# Patient Record
Sex: Female | Born: 1940
Health system: Southern US, Community
[De-identification: ages and names within clinical notes are randomized; demographics above are authoritative.]

## PROBLEM LIST (undated history)

## (undated) DIAGNOSIS — R238 Other skin changes: Secondary | ICD-10-CM

## (undated) DIAGNOSIS — H409 Unspecified glaucoma: Secondary | ICD-10-CM

## (undated) DIAGNOSIS — E785 Hyperlipidemia, unspecified: Secondary | ICD-10-CM

## (undated) DIAGNOSIS — R51 Headache: Secondary | ICD-10-CM

## (undated) DIAGNOSIS — I341 Nonrheumatic mitral (valve) prolapse: Secondary | ICD-10-CM

## (undated) DIAGNOSIS — R233 Spontaneous ecchymoses: Secondary | ICD-10-CM

## (undated) DIAGNOSIS — M1990 Unspecified osteoarthritis, unspecified site: Secondary | ICD-10-CM

## (undated) DIAGNOSIS — I351 Nonrheumatic aortic (valve) insufficiency: Secondary | ICD-10-CM

## (undated) DIAGNOSIS — M8580 Other specified disorders of bone density and structure, unspecified site: Secondary | ICD-10-CM

## (undated) DIAGNOSIS — C439 Malignant melanoma of skin, unspecified: Secondary | ICD-10-CM

## (undated) DIAGNOSIS — R519 Headache, unspecified: Secondary | ICD-10-CM

## (undated) HISTORY — DX: Headache: R51

## (undated) HISTORY — DX: Malignant melanoma of skin, unspecified: C43.9

## (undated) HISTORY — DX: Other specified disorders of bone density and structure, unspecified site: M85.80

## (undated) HISTORY — PX: EYE SURGERY: SHX253

## (undated) HISTORY — PX: HERNIA REPAIR: SHX51

## (undated) HISTORY — DX: Nonrheumatic aortic (valve) insufficiency: I35.1

## (undated) HISTORY — DX: Hyperlipidemia, unspecified: E78.5

## (undated) HISTORY — PX: JOINT REPLACEMENT: SHX530

## (undated) HISTORY — DX: Nonrheumatic mitral (valve) prolapse: I34.1

## (undated) HISTORY — DX: Headache, unspecified: R51.9

## (undated) HISTORY — DX: Unspecified glaucoma: H40.9

---

## 1991-08-09 DIAGNOSIS — C439 Malignant melanoma of skin, unspecified: Secondary | ICD-10-CM

## 1991-08-09 HISTORY — DX: Malignant melanoma of skin, unspecified: C43.9

## 1998-02-19 ENCOUNTER — Other Ambulatory Visit: Admission: RE | Admit: 1998-02-19 | Discharge: 1998-02-19 | Payer: Self-pay | Admitting: *Deleted

## 1998-03-20 ENCOUNTER — Ambulatory Visit (HOSPITAL_COMMUNITY): Admission: RE | Admit: 1998-03-20 | Discharge: 1998-03-20 | Payer: Self-pay | Admitting: *Deleted

## 1999-02-23 ENCOUNTER — Other Ambulatory Visit: Admission: RE | Admit: 1999-02-23 | Discharge: 1999-02-23 | Payer: Self-pay | Admitting: *Deleted

## 1999-03-23 ENCOUNTER — Ambulatory Visit (HOSPITAL_COMMUNITY): Admission: RE | Admit: 1999-03-23 | Discharge: 1999-03-23 | Payer: Self-pay | Admitting: *Deleted

## 1999-03-23 ENCOUNTER — Encounter: Payer: Self-pay | Admitting: *Deleted

## 2000-02-25 ENCOUNTER — Other Ambulatory Visit: Admission: RE | Admit: 2000-02-25 | Discharge: 2000-02-25 | Payer: Self-pay | Admitting: *Deleted

## 2000-03-24 ENCOUNTER — Encounter: Payer: Self-pay | Admitting: *Deleted

## 2000-03-24 ENCOUNTER — Ambulatory Visit (HOSPITAL_COMMUNITY): Admission: RE | Admit: 2000-03-24 | Discharge: 2000-03-24 | Payer: Self-pay | Admitting: *Deleted

## 2001-02-15 ENCOUNTER — Other Ambulatory Visit: Admission: RE | Admit: 2001-02-15 | Discharge: 2001-02-15 | Payer: Self-pay | Admitting: *Deleted

## 2001-03-28 ENCOUNTER — Ambulatory Visit (HOSPITAL_COMMUNITY): Admission: RE | Admit: 2001-03-28 | Discharge: 2001-03-28 | Payer: Self-pay | Admitting: *Deleted

## 2001-03-28 ENCOUNTER — Encounter: Payer: Self-pay | Admitting: *Deleted

## 2001-12-18 ENCOUNTER — Encounter: Payer: Self-pay | Admitting: Internal Medicine

## 2001-12-18 ENCOUNTER — Encounter: Admission: RE | Admit: 2001-12-18 | Discharge: 2001-12-18 | Payer: Self-pay | Admitting: Internal Medicine

## 2002-04-02 ENCOUNTER — Encounter: Payer: Self-pay | Admitting: *Deleted

## 2002-04-02 ENCOUNTER — Ambulatory Visit (HOSPITAL_COMMUNITY): Admission: RE | Admit: 2002-04-02 | Discharge: 2002-04-02 | Payer: Self-pay | Admitting: *Deleted

## 2003-02-28 ENCOUNTER — Other Ambulatory Visit: Admission: RE | Admit: 2003-02-28 | Discharge: 2003-02-28 | Payer: Self-pay | Admitting: Obstetrics and Gynecology

## 2003-03-11 ENCOUNTER — Encounter: Payer: Self-pay | Admitting: Obstetrics and Gynecology

## 2003-03-11 ENCOUNTER — Ambulatory Visit (HOSPITAL_COMMUNITY): Admission: RE | Admit: 2003-03-11 | Discharge: 2003-03-11 | Payer: Self-pay | Admitting: Obstetrics and Gynecology

## 2004-03-01 ENCOUNTER — Other Ambulatory Visit: Admission: RE | Admit: 2004-03-01 | Discharge: 2004-03-01 | Payer: Self-pay | Admitting: Obstetrics and Gynecology

## 2004-03-16 ENCOUNTER — Ambulatory Visit (HOSPITAL_COMMUNITY): Admission: RE | Admit: 2004-03-16 | Discharge: 2004-03-16 | Payer: Self-pay | Admitting: Obstetrics and Gynecology

## 2005-03-23 ENCOUNTER — Other Ambulatory Visit: Admission: RE | Admit: 2005-03-23 | Discharge: 2005-03-23 | Payer: Self-pay | Admitting: Obstetrics and Gynecology

## 2005-03-24 ENCOUNTER — Ambulatory Visit (HOSPITAL_COMMUNITY): Admission: RE | Admit: 2005-03-24 | Discharge: 2005-03-24 | Payer: Self-pay | Admitting: Obstetrics and Gynecology

## 2006-03-07 ENCOUNTER — Ambulatory Visit (HOSPITAL_BASED_OUTPATIENT_CLINIC_OR_DEPARTMENT_OTHER): Admission: RE | Admit: 2006-03-07 | Discharge: 2006-03-07 | Payer: Self-pay | Admitting: Surgery

## 2006-03-27 ENCOUNTER — Ambulatory Visit (HOSPITAL_COMMUNITY): Admission: RE | Admit: 2006-03-27 | Discharge: 2006-03-27 | Payer: Self-pay | Admitting: Obstetrics and Gynecology

## 2006-05-17 ENCOUNTER — Ambulatory Visit (HOSPITAL_BASED_OUTPATIENT_CLINIC_OR_DEPARTMENT_OTHER): Admission: RE | Admit: 2006-05-17 | Discharge: 2006-05-17 | Payer: Self-pay | Admitting: Surgery

## 2006-06-27 ENCOUNTER — Encounter: Admission: RE | Admit: 2006-06-27 | Discharge: 2006-06-27 | Payer: Self-pay | Admitting: Surgery

## 2006-12-06 ENCOUNTER — Inpatient Hospital Stay (HOSPITAL_COMMUNITY): Admission: AD | Admit: 2006-12-06 | Discharge: 2006-12-08 | Payer: Self-pay | Admitting: Surgery

## 2006-12-06 ENCOUNTER — Encounter: Admission: RE | Admit: 2006-12-06 | Discharge: 2006-12-06 | Payer: Self-pay | Admitting: Surgery

## 2006-12-22 ENCOUNTER — Encounter: Admission: RE | Admit: 2006-12-22 | Discharge: 2006-12-22 | Payer: Self-pay | Admitting: Surgery

## 2007-03-08 ENCOUNTER — Encounter: Admission: RE | Admit: 2007-03-08 | Discharge: 2007-03-08 | Payer: Self-pay | Admitting: Surgery

## 2007-04-02 ENCOUNTER — Ambulatory Visit (HOSPITAL_COMMUNITY): Admission: RE | Admit: 2007-04-02 | Discharge: 2007-04-02 | Payer: Self-pay | Admitting: Obstetrics and Gynecology

## 2007-04-12 ENCOUNTER — Encounter: Admission: RE | Admit: 2007-04-12 | Discharge: 2007-04-12 | Payer: Self-pay | Admitting: Surgery

## 2007-06-15 ENCOUNTER — Emergency Department (HOSPITAL_COMMUNITY): Admission: EM | Admit: 2007-06-15 | Discharge: 2007-06-15 | Payer: Self-pay | Admitting: Emergency Medicine

## 2007-06-20 ENCOUNTER — Encounter: Admission: RE | Admit: 2007-06-20 | Discharge: 2007-06-20 | Payer: Self-pay | Admitting: Surgery

## 2007-06-25 ENCOUNTER — Emergency Department (HOSPITAL_COMMUNITY): Admission: EM | Admit: 2007-06-25 | Discharge: 2007-06-25 | Payer: Self-pay | Admitting: Emergency Medicine

## 2008-04-03 ENCOUNTER — Ambulatory Visit (HOSPITAL_COMMUNITY): Admission: RE | Admit: 2008-04-03 | Discharge: 2008-04-03 | Payer: Self-pay | Admitting: Obstetrics and Gynecology

## 2009-04-06 ENCOUNTER — Ambulatory Visit (HOSPITAL_COMMUNITY): Admission: RE | Admit: 2009-04-06 | Discharge: 2009-04-06 | Payer: Self-pay | Admitting: Obstetrics and Gynecology

## 2010-04-08 ENCOUNTER — Ambulatory Visit (HOSPITAL_COMMUNITY): Admission: RE | Admit: 2010-04-08 | Discharge: 2010-04-08 | Payer: Self-pay | Admitting: Obstetrics and Gynecology

## 2010-07-08 DIAGNOSIS — I351 Nonrheumatic aortic (valve) insufficiency: Secondary | ICD-10-CM

## 2010-07-08 HISTORY — DX: Nonrheumatic aortic (valve) insufficiency: I35.1

## 2010-07-19 ENCOUNTER — Encounter
Admission: RE | Admit: 2010-07-19 | Discharge: 2010-07-19 | Payer: Self-pay | Source: Home / Self Care | Attending: Family Medicine | Admitting: Family Medicine

## 2010-08-30 LAB — DIFFERENTIAL
Basophils Absolute: 0 10*3/uL (ref 0.0–0.1)
Basophils Relative: 1 % (ref 0–1)
Lymphocytes Relative: 26 % (ref 12–46)
Monocytes Relative: 9 % (ref 3–12)
Neutro Abs: 4 10*3/uL (ref 1.7–7.7)
Neutrophils Relative %: 64 % (ref 43–77)

## 2010-08-30 LAB — COMPREHENSIVE METABOLIC PANEL
ALT: 11 U/L (ref 0–35)
AST: 18 U/L (ref 0–37)
Calcium: 9.4 mg/dL (ref 8.4–10.5)
GFR calc Af Amer: >60 mL/min (ref >60)
Sodium: 138 mEq/L (ref 135–145)
Total Protein: 7.8 g/dL (ref 6.0–8.3)

## 2010-08-30 LAB — URINALYSIS, ROUTINE W REFLEX MICROSCOPIC
Hgb urine dipstick: NEGATIVE
Specific Gravity, Urine: 1.019 (ref 1.005–1.030)
Urobilinogen, UA: 0.2 mg/dL (ref 0.0–1.0)
pH: 6.5 (ref 5.0–8.0)

## 2010-08-30 LAB — CBC
HCT: 40.1 % (ref 36.0–46.0)
Hemoglobin: 13 g/dL (ref 12.0–15.0)
MCH: 31.4 pg (ref 26.0–34.0)
RBC: 4.14 MIL/uL (ref 3.87–5.11)

## 2010-08-30 LAB — PROTIME-INR
INR: 0.97 (ref 0.00–1.49)
Prothrombin Time: 13.1 seconds (ref 11.6–15.2)

## 2010-08-30 NOTE — H&P (Addendum)
Kelly Grimes, Kelly Grimes             ACCOUNT NO.:  [id number]  MEDICAL RECORD NO.:  [id number]         PATIENT TYPE:  LINP  LOCATION:                               FACILITY:  [facility]  PHYSICIAN:  Georges Lynch. Dylynn Ketner, M.D.DATE OF BIRTH:  1941/04/04  DATE OF ADMISSION:  08/31/2010 DATE OF DISCHARGE:                             HISTORY & PHYSICAL   CHIEF COMPLAINT:  Right hip pain.  BRIEF HISTORY:  Kelly Grimes has been followed by Dr. Darrelyn Hillock for worsening pain in the right hip.  She is to a point now where she is unable to complete daily tasks without pain and it is significantly limiting what she is able to do.  She now presents for right total hip arthroplasty.  MEDICATION ALLERGIES:  None.  PRIMARY CARE PHYSICIAN:  Juluis Rainier, MD  CURRENT MEDICATIONS: 1. Travatan Z once daily in both eyes. 2. Fish oil. 3. Bilberry. 4. Multivitamin. 5. Calcium plus D. 6. Vitamin C. 7. She will discontinue fish oil and multivitamin prior to surgery.  PAST MEDICAL HISTORY: 1. End-stage arthritis of the right hip. 2. Cataracts. 3. Heart murmur. 4. History of hemorrhoids.  Denies any blood in the stool. 5. History of urinary tract infection several years ago. 6. History of cystitis. 7. She does have cancer history of melanoma. 8. History of measles as a child. 9. History of mumps as a child. 10.History of rubella as a child. 11.History of strep throat infections. 12.History of osteopenia. 13.Arthritis. 14.History of menopause.  PAST SURGICAL HISTORY: 1. Inguinal hernia repair x2 in 2007 and again inguinal hernia repair     x1 in 2008. 2. Femoral hernia repair in 2009.  FAMILY HISTORY:  Father passed of pancreatic cancer at age of 40. Mother passed of complications from Alzheimer's at the age of 21.  SOCIAL HISTORY:  The patient is married.  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  She denies past or present use of tobacco or alcohol products.   She lives at home with her family, however, she will need to go to a skilled nursing facility following her hospital stay and she would like to go to [place] if possible, so we will need to get a social work consult to arrange this.  REVIEW OF SYSTEMS:  GENERAL:  Negative for fevers, chills, or weight change.  HEENT/NEURO:  Negative for headache, blurred vision, or insomnia.  DERMATOLOGIC:  Negative for rash or lesion.  RESPIRATORY: Negative shortness of breath at rest or with exertion.  CARDIOVASCULAR: Negative for chest pain or palpitations.  GI:  Negative for nausea, vomiting, or diarrhea.  GU:  Negative for hematuria, or dysuria. MUSCULOSKELETAL:  Positive for joint pain.  PHYSICAL EXAMINATION:  VITAL SIGNS:  Pulse 80, respirations 18, and blood pressure 122/80 in the left arm. GENERAL:  Kelly Grimes is alert and oriented x3.  She is well developed, well nourished in no apparent distress. HEENT:  Normocephalic, atraumatic.  Extraocular movements are intact. NECK:  Supple, full range of motion without lymphadenopathy. CHEST:  Lungs are clear to auscultation bilaterally without wheezes, rhonchi, or rales. HEART:  Regular rate and rhythm  without murmur, S1 and S2 sounds are appreciated. ABDOMEN:  Bowel sounds are present in all 4 quadrants.  Abdomen is soft, nontender, and nondistended to palpation. EXTREMITIES:  Right hip significantly decreased range of motion and pain with attempted range of motion. SKIN:  Unremarkable. NEUROLOGIC:  Intact.  Carotid pulses 2+ bilaterally without bruit.  RADIOGRAPHS:  AP and lateral views of the right hip reveal severe end- stage degenerative arthritis in the right hip.  IMPRESSION:  Degenerative arthritis of the right hip.  PLAN:  Right total hip arthroplasty to be performed by Dr. Darrelyn Hillock.     Rozell Searing, PAC   ______________________________ Georges Lynch Darrelyn Hillock, M.D.   LD/MEDQ  D:  08/29/2010  T:  08/29/2010  Job:   [id number]  cc:   Juluis Rainier, MD  Electronically Signed by Ranee Gosselin M.D. on 08/30/2010 11:51:56 AM Electronically Signed by Rozell Searing  on 08/30/2010 03:31:57 PM

## 2010-08-31 ENCOUNTER — Inpatient Hospital Stay (HOSPITAL_COMMUNITY)
Admission: RE | Admit: 2010-08-31 | Discharge: 2010-09-03 | Payer: Self-pay | Source: Home / Self Care | Attending: Orthopedic Surgery | Admitting: Orthopedic Surgery

## 2010-09-01 LAB — TYPE AND SCREEN: Antibody Screen: NEGATIVE

## 2010-09-01 LAB — PROTIME-INR
INR: 1.34 (ref 0.00–1.49)
Prothrombin Time: 16.8 seconds — ABNORMAL HIGH (ref 11.6–15.2)

## 2010-09-01 LAB — ABO/RH: ABO/RH(D): A POS

## 2010-09-03 LAB — HEMOGLOBIN AND HEMATOCRIT, BLOOD
HCT: 25.3 % — ABNORMAL LOW (ref 36.0–46.0)
Hemoglobin: 8.5 g/dL — ABNORMAL LOW (ref 12.0–15.0)

## 2010-09-03 LAB — PROTIME-INR: INR: 1.33 (ref 0.00–1.49)

## 2010-09-03 NOTE — Discharge Summary (Addendum)
Kelly Grimes, Kelly Grimes             ACCOUNT NO.:  [id number]  MEDICAL RECORD NO.:  [id number]          PATIENT TYPE:  INP  LOCATION:  1620                         FACILITY:  [facility]  PHYSICIAN:  Georges Lynch. Gioffre, M.D.DATE OF BIRTH:  05-21-1941  DATE OF ADMISSION:  08/31/2010 DATE OF DISCHARGE:                              DISCHARGE SUMMARY   ADMITTING DIAGNOSES: 1. End-stage arthritis of the right hip. 2. Cataracts. 3. Heart murmur. 4. History of hemorrhoids. 5. History of urinary tract infection. 6. History of cystitis. 7. Skin cancer, history of melanoma. 8. History of measles as a child. 9. History of mumps as a child. 10.History of rubella as a child. 11.History of strep throat. 12.History of osteopenia. 13.Arthritis. 14.History of menopause.  DISCHARGE DIAGNOSES: 1. End-stage arthritis of the right hip, status post right total hip     arthroplasty. 2. End-stage arthritis of the right hip. 3. Cataracts. 4. Heart murmur. 5. History of hemorrhoids. 6. History of urinary tract infection. 7. History of cystitis. 8. Skin cancer, history of melanoma. 9. History of measles as a child. 10.History of mumps as a child. 11.History of rubella as a child. 12.History of strep throat. 13.History of osteopenia. 14.Arthritis. 15.History of menopause.  HOSPITAL COURSE:  The patient was admitted to [facility] on August 31, 2010.  She underwent the stated procedure without complication.  After adequate time in the recovery room, she was taken to the orthopedic floor.  She was placed on PCA, analgesics and muscle relaxants for pain control and the patient was started on Coumadin, heparin protocol per Pharmacy to protect against blood clots.  On operative day that evening, the patient became nauseated from the PCA so on postoperative day #1, we discontinued PCA and gave her pain medication by mouth which she seems to tolerate much better. Postoperative day #1, the  patient was able to ambulate with physical therapy and did quite well.  Postoperative day #2, dressing was changed. Wound was well approximated.  No signs of infection.  She continued to do well with her physical therapy and pain was well controlled. Postoperative day #3, the patient continues to progress, doing well with her therapy; however, still needs assistance with stairs and daily tasks.  Disposition to skilled nursing facility on postoperative day #3, September 03, 2010.  PROCEDURE:  On August 31, 2010, Ms. Ige was taken to the operating room by surgeon Dr. Ranee Gosselin, assistant Dr. Durene Romans and Rozell Searing, PA-C.  She underwent right total hip arthroplasty.  The procedure was performed under general anesthesia.  Routine orthopedic prep and drape were carried out.  She received 1 g of IV Ancef preoperatively.  There were no complications with the procedure.  The patient was returned to the recovery room in satisfactory condition. Postoperative radiograph reveals new right total hip prosthesis that is anatomically aligned.  LABORATORY DATA:  Preoperative labs revealed CBC with a white count of 6.2, hemoglobin 13, hematocrit 40.1 and a platelet count of 271. Preoperative INR is 0.97.  She was not on anticoagulants preoperatively. Preoperative chemistry panel was unremarkable as is preoperative urinalysis.  Preoperative PCR for MRSA was negative  for Staph aureus, if positive we will treat with antibiotics appropriately.  On postoperative day #1, the patient's hemoglobin had dropped to 9.7.  Her INR had reached 1.3.  Postoperative day #2, hemoglobin dropped further to 9.0, INR had reached 1.85 and on postoperative day #3, hemoglobin dropped to a low of 8.5.  She has not required blood transfusion throughout her hospital stay and her INR was at 1.33.  MEDICATIONS ON DISCHARGE: 1. Acetaminophen 325 mg 1-2 tablets p.o. q. 4-6 hours p.r.n.     breakthrough pain or  fever. 2. Colace 100 mg 1 tablet p.o. b.i.d. p.r.n. constipation. 3. Robaxin 500 mg 1 tablet p.o. q.6 hours p.r.n. muscle spasm. 4. Ferrous sulfate 325 mg 1 tablet p.o. t.i.d. 5. Oxycodone/acetaminophen 10/650 mg 1 tablet p.o. q.4-6 hours p.r.n.     pain. 6. Travatan Z ophthalmic 0.004% one drop both eyes q.h.s. 7. Coumadin.  This should be dosed per skilled nursing facility.  Her     INR needs to be kept between 2 and 3 to remain therapeutic. 8. Lovenox 40 mg subcu once on Saturday and then it will be     discontinued.  ACTIVITY:  She should increase her activity slowly.  She needs to use walker for ambulation.  She works with physical therapy at the skilled nursing facility.  DIET:  No restrictions.  WOUND CARE:  Daily dressing changes will be needed.  FOLLOWUP:  She should follow up with Dr. Darrelyn Hillock in 2 weeks from the day of surgery.  Skilled nursing facility should contact the office at [phone] to arrange this appointment.  CONDITION ON DISCHARGE:  Improving.     Rozell Searing, PAC   ______________________________ Georges Lynch Darrelyn Hillock, M.D.    LD/MEDQ  D:  09/03/2010  T:  09/03/2010  Job:  [id number]  Electronically Signed by Rozell Searing  on 09/03/2010 02:28:15 PM Electronically Signed by Ranee Gosselin M.D. on 09/06/2010 08:06:11 AM

## 2010-09-06 NOTE — Op Note (Signed)
NAMELAYCI, STENGLEIN             ACCOUNT NO.:  [id number]  MEDICAL RECORD NO.:  [id number]          PATIENT TYPE:  INP  LOCATION:  1620                         FACILITY:  [facility]  PHYSICIAN:  Georges Lynch. Gioffre, M.D.DATE OF BIRTH:  1940/11/14  DATE OF PROCEDURE: DATE OF DISCHARGE:                              OPERATIVE REPORT   PREOPERATIVE DIAGNOSIS:  Severe degenerative arthritis of the right hip with some shortening of the right lower extremity about a quarter and a half inch.  POSTOPERATIVE DIAGNOSIS:  Severe degenerative arthritis of the right hip with some shortening of the right lower extremity about a quarter and a half inch.  SURGEON:  Georges Lynch. Gioffre, M.D.  ASSISTANTS: 1. Madlyn Frankel Charlann Boxer, M.D. 2. Rozell Searing, [facility]  OPERATION:  Right total hip arthroplasty utilizing the DePuy system. The sizes used; the femoral stem was a size 4 standard Tri-Lock stem. We utilized a AltrX polyethylene acetabular liner, +4 neutral.  We used a Pinnacle cup size 50-mm outside diameter with a hole eliminator.  We utilized 1 cancellus screw for fixation 30 mm in length.  The head was a +1, 32-mm diameter ceramic femoral head.  DESCRIPTION OF PROCEDURE:  Under general anesthesia, the patient on the left side right side up.  We went through the appropriate time-out procedure.  Prior to that in the holding area, I marked the appropriate right leg.  The patient had 1 g of IV Ancef.  After sterile prep and drape of the right hip was carried out, posterolateral approach to hip was carried out.  Bleeders were identified and cauterized.  I inserted self-retaining retractors.  I then went down and incised the iliotibial band, separated the gluteal muscle by blunt dissection.  I then went down and partially detached the external rotators with great care taken not to injure the sciatic nerve.  I then did a capsulectomy.  Following that, I dislocated the femoral head.  She had severe arthritic  changes. I then amputated the head at the appropriate neck length.  I then used the box osteotome to remove the cancellous bone from the trochanter and then the widening reamer, and then we utilized the canal finder.  I then irrigated out the femoral stem and femoral canal multiple times during the procedure.  I then rasped the femur up to a size 4 Tri-Lock stem. Once this was done, I then prepared the acetabulum by completing the capsulectomy.  I then reamed the acetabulum up to a size 49 for a 50-mm cup.  The permanent 50-mm Pinnacle cup was inserted.  We affixed the cup with one screw after we checked the orientation with the Charnley angle finder.  At this time, we then inserted our manhole cover.  We then inserted our permanent AltrX cup.  Following that, we inserted our permanent Tri-Lock stem size 4.  Once this was done, we went through various neck lengths and finally selected the +1 neck length.  This was a standard Tri-Lock stem we used.  We had excellent stability, excellent leg length.  We measured leg length both with the +1 and +5, and felt the +1 was the most  accurate to use for leg length and stability.  We cleared out the acetabulum, reduced the hip, took hip through motion with a permanent ceramic head in place.  We had good position, good stability, and good leg length.  I then thoroughly irrigated out the area.  I injected 10 cc of Surgiflo into the lower acetabular region and then some thrombin-soaked Gelfoam and closed the wound in layers in usual fashion. Sterile Neosporin dressing was applied.  The patient left the operating room in satisfactory condition.          ______________________________ Georges Lynch Darrelyn Hillock, M.D.     RAG/MEDQ  D:  08/31/2010  T:  08/31/2010  Job:  [id number]  cc:   Dr. Zachery Dauer  Electronically Signed by Ranee Gosselin M.D. on 09/06/2010 08:06:09 AM

## 2010-09-21 ENCOUNTER — Ambulatory Visit: Payer: Medicare Other | Attending: Orthopedic Surgery | Admitting: Physical Therapy

## 2010-09-21 DIAGNOSIS — IMO0001 Reserved for inherently not codable concepts without codable children: Secondary | ICD-10-CM | POA: Insufficient documentation

## 2010-09-21 DIAGNOSIS — R262 Difficulty in walking, not elsewhere classified: Secondary | ICD-10-CM | POA: Insufficient documentation

## 2010-09-21 DIAGNOSIS — M6281 Muscle weakness (generalized): Secondary | ICD-10-CM | POA: Insufficient documentation

## 2010-09-21 DIAGNOSIS — R5381 Other malaise: Secondary | ICD-10-CM | POA: Insufficient documentation

## 2010-09-24 ENCOUNTER — Encounter: Payer: Medicare Other | Admitting: Physical Therapy

## 2010-09-28 ENCOUNTER — Encounter: Payer: Medicare Other | Admitting: Physical Therapy

## 2010-10-01 ENCOUNTER — Encounter: Payer: Medicare Other | Admitting: Physical Therapy

## 2010-10-05 ENCOUNTER — Encounter: Payer: Medicare Other | Admitting: Physical Therapy

## 2010-10-08 ENCOUNTER — Encounter: Payer: Medicare Other | Admitting: Physical Therapy

## 2010-12-24 NOTE — Op Note (Signed)
NAMELAYKIN, RAINONE             ACCOUNT NO.:  [id number]   MEDICAL RECORD NO.:  [id number]          PATIENT TYPE:  AMB   LOCATION:  DSC                          FACILITY:  [facility]   PHYSICIAN:  Wilmon Arms. Corliss Skains, M.D. DATE OF BIRTH:  [birth year]   DATE OF PROCEDURE:  03/07/2006  DATE OF DISCHARGE:                                 OPERATIVE REPORT   PREOPERATIVE DIAGNOSIS:  Right inguinal hernia.   POSTOPERATIVE DIAGNOSIS:  Right inguinal hernia.   PROCEDURE PERFORMED:  Right inguinal hernia repair with mesh.   SURGEON:  Wilmon Arms. Tsuei, M.D.   ANESTHESIA:  General.   INDICATIONS:  The patient is 70 year old female in excellent health who  presents with an incidental finding of right inguinal bulge.  This has been  present for several months.  The patient is very active and does a lot of  yard work and this is where she first noticed the bulge.  There is no real  pain but this area has become larger and more firm.  She presents for  elective repair.   DESCRIPTION OF PROCEDURE:  The patient brought to the operating room and  placed in supine position on operating table.  After an adequate level of  general anesthesia was obtained, the patient's right groin was shaved,  prepped with Betadine and draped in sterile fashion.  The area above her  inguinal ligament was infiltrated with 0.25% Marcaine with epinephrine.  The  incision was made above the inguinal ligament.  Dissection was carried down  into the external oblique fascia.  The external oblique fascia was opened  along direction of its fibers.  The round ligament was identified was  circumferentially dissected.  Two small hernia defects were noted at the  internal ring and immediately adjacent the internal ring.  The hernia sacs  were reduced through these defects.  No other defects were noted.  The  larger defect was closed with a figure-of-eight 2-0 Vicryl suture.  A  keyhole mesh was then cut to fit the wound and secured  beginning at the  pubic tubercle.  This was secured to the internal oblique fascia superiorly  and the Cooper's ligament inferiorly.  The tails were tucked around the  round ligament and secured together with 2-0 Vicryl suture.  The tails were  tucked underneath the external oblique fascia.  The external oblique fascia  was then closed with 2-0 Vicryl in running fashion.  3-0 Vicryl was used to  close subcutaneous tissues and 4-0 Monocryl was used to close the skin in  subcuticular fashion.  Steri-Strips and clean dressings were applied.  The  patient was then extubated, brought to recovery in stable condition.  All  sponge, instrument and needle counts correct.     Wilmon Arms. Tsuei, M.D.  Electronically Signed    MKT/MEDQ  D:  03/07/2006  T:  03/07/2006  Job:  [id number]   cc:   Marcelino Duster L. Vincente Poli, M.D.

## 2010-12-24 NOTE — Op Note (Signed)
Kelly Grimes, Kelly Grimes             ACCOUNT NO.:  [id number]   MEDICAL RECORD NO.:  [id number]          PATIENT TYPE:  INP   LOCATION:  5735                         FACILITY:  [facility]   PHYSICIAN:  Wilmon Arms. Corliss Skains, M.D. DATE OF BIRTH:  July 27, 1941   DATE OF PROCEDURE:  12/06/2006  DATE OF DISCHARGE:                               OPERATIVE REPORT   PREOPERATIVE DIAGNOSIS:  Incarcerated recurrent right inguinal hernia.   POSTOPERATIVE DIAGNOSIS:  Recurrent right inguinal hernia.   PROCEDURE PERFORMED:  1. Diagnostic laparoscopy.  2. Open right preperitoneal inguinal hernia repair with mesh.   SURGEON:  Wilmon Arms. Corliss Skains, M.D.   ASSISTANT:  Adolph Pollack, M.D.   ANESTHESIA:  General endotracheal.   INDICATIONS:  The patient is a 70 year old female in excellent health,  who is status post a right inguinal hernia mesh repair in 2007.  She had  an early recurrence and was found to have torn the mesh loose from the  surrounding muscle.  This was repaired with another sheet of mesh.  The  patient has been doing very well.  She presented today with acute onset  of right groin pain.  A CT scan showed a recurrence of her hernia with  incarcerated small bowel.  She was emergently admitted to the hospital  in preparation for exploration.  In the preoperative area, she was noted  to have decreased pain and the bulge had resolved.  This seemed like the  incarcerated small bowel may have been reduced.   DESCRIPTION OF PROCEDURE:  The patient was brought to the operating room  and placed in a supine position on the operating room table with her  arms tucked.  After an adequate level of general anesthesia was  obtained, a Foley catheter was placed under sterile technique.  The  patient's abdomen was prepped with Betadine and draped in sterile  fashion.  Pneumatic compression hose had been placed prior to induction.  A time-out was taken to assure the proper patient and proper procedure.  The area below her umbilicus was infiltrated with 0.25% Marcaine.  A  transverse incision was made and dissection was carried down to the  fascia, which was opened vertically.  The peritoneal cavity was bluntly  entered.  A stay sutures of 0 Vicryl was placed around the fascial  opening.  The Hasson cannula was inserted and secured with a stay  suture.  Pneumoperitoneum is obtained by insufflating CO2, maintaining a  maximum pressure of 15 mmHg.  The laparoscope was inserted.  There was  no incarcerated small bowel in the pelvis.  The patient was positioned  in reversed Trendelenburg position.  We could visualize the segment of  small bowel that had been incarcerated.  This was mildly erythematous  compared to the surrounding small bowel, but appeared viable.  There was  no sign of perforation or leakage.  A very tiny hernia defect was seen  medially.  We could not visualize any mesh in the preperitoneal space.  We made the decision to convert to a preperitoneal laparoscopic repair.  Pneumoperitoneum was released.  The Hasson cannula was removed.  We  extended our incision slightly to the patient's right.  We located the  anterior rectus sheath and incised it transversely.  The rectus muscle  was retracted laterally and we entered the retrovascular space.  The  Spacemaker balloon was inserted and inflated.  The Spacemaker balloon  was then were removed and the balloon trocar was inserted.  Pneumopreperitoneum was obtained by insufflating CO2 and maintaining at  maximum pressure of 12 mmHg.  Two 5-mm ports were placed in the lower  left quadrant.  We then attempted to dissect open the preperitoneal  space.  The peritoneum was densely adherent to the undersurface of the  previously placed mesh.  We attempted to dissect this free with the  blunt graspers and the Kitner dissectors.  We were unable to freely  dissect this away from the mesh.  We could not see any clear hernia  defect.  The  decision was then made to convert to an open groin  exploration with hernia repair.  We opened up the patient's previous  inguinal incision after releasing the gas from the preperitoneal space  and removing the trocars.  Dissection was carried down to the external  oblique fascia, which was opened with cautery.  The mesh was  encountered.  Cautery was used dissected the external oblique fascia off  of the mesh.  We were able to bluntly dissected medially to the pubic  tubercle.  The mesh seemed to be intact.  However, we were able to  dissect in the retromuscular space behind the rectus muscle and come  around the medial edge of the mesh.  We bluntly dissected the peritoneum  away from the undersurface of the mesh.  The mesh was then divided with  a knife.  This opened up the preperitoneal space widely.  The anatomy  was fairly distorted due to the attenuated muscle and the scar tissue.  The small peritoneal defects were identified.  The small bowel appeared  healthy and viable.  These peritoneal defects were closed with figure-of-  eight 3-0 Vicryl sutures.  The preperitoneal space was now widely opened  due to our attempts at a laparoscopic preperitoneal repair.  A 6 x 6-  inch Prolene mesh was brought onto the field.  We then bluntly dissected  with finger dissection the preperitoneal space widely.  We were able  palpate the pubic symphysis medially as well as the edge of the pubis  and Cooper's ligament laterally.  We trimmed the 6 x 6-inch mass to  approximately 5 x 6 inches.  We inserted this mesh in the preperitoneal  space, extending it medially across the midline and laterally to cover  all of the preperitoneal space.  The edges were tucked down below  Cooper's ligament.  The hernia tacker device was then used to secure the  mesh to the pubic symphysis medially along the edge of the pubis and  across Cooper's ligament.  The upper edge of the mesh was secured with several  interrupted transvaginal 2-0 Prolene sutures.  This mesh seemed  secure and covered all of the hernia defects.  We then closed the  previous mesh in 2 layers with 0 Prolene sutures.  The external oblique  fascia was closed in a similar fashion.  The subcutaneous tissues were  irrigated and closed with a deep layer of 3-0 Vicryl and a subcuticular  4-0 Monocryl.  The anterior rectus sheath was closed with a 0 Vicryl at  the umbilical port site.  4-0 Monocryl  was used to close all skin  incisions.  Steri-Strips and clean dressings were applied.   FINDINGS:  The previous hernia mesh seemed to be intact.  However, there  seemed to be 2 very small hernia defects laterally, 1 just anterior and  1 just posterior to Cooper's ligament.  These did not appear to be true  femoral hernias, but these were likely the point of herniation with  incarceration.  All these areas were covered with the mesh.      Wilmon Arms. Tsuei, M.D.  Electronically Signed     MKT/MEDQ  D:  12/06/2006  T:  12/07/2006  Job:  [id number]

## 2010-12-24 NOTE — Discharge Summary (Signed)
Kelly Grimes, Kelly Grimes             ACCOUNT NO.:  [id number]   MEDICAL RECORD NO.:  [id number]          PATIENT TYPE:  INP   LOCATION:  5735                         FACILITY:  [facility]   PHYSICIAN:  Wilmon Arms. Corliss Skains, M.D. DATE OF BIRTH:  1941-08-01   DATE OF ADMISSION:  12/06/2006  DATE OF DISCHARGE:  12/08/2006                               DISCHARGE SUMMARY   ADMISSION DIAGNOSIS:  Recurrent right inguinal hernia.   DISCHARGE DIAGNOSIS:  Recurrent right inguinal hernia.   BRIEF HISTORY:  The patient is a 70 year old female in good health who  has had problems with a right inguinal hernia.  This was initially  repaired last year.  She had an early recurrence and underwent another  groin exploration with repair.  The patient has been doing well but had  sudden onset of pain which was very severe in the right groin.  She also  developed a bulge in this area.  A CT scan showed a recurrence with  small bowel incarcerated.  The patient was emergently admitted to the  hospital and brought to the operating room that evening for re-  exploration and repair.  Initially we attempted a laparoscopic approach.  We were able to visualize the small bowel and this was noted to be  viable with no sign of strangulation.  However, we were unable to  perform a laparoscopic preperitoneal repair.  Therefore, we converted to  an open procedure and placed a large sheet of preperitoneal mesh.  Postoperatively the patient has done remarkably well.  She had very  little swelling and her pain was well-controlled.  She was discharged on  postop day #2 with Vicodin p.r.n. for pain.  She should use ice and  should refrain from any strenuous activity.  Her wounds are healing well  with no sign of infection.  Follow-up in 2 weeks with Dr. Corliss Skains.      Wilmon Arms. Tsuei, M.D.  Electronically Signed     MKT/MEDQ  D:  01/10/2007  T:  01/10/2007  Job:  [id number]

## 2010-12-24 NOTE — Op Note (Signed)
Kelly Grimes, Kelly Grimes             ACCOUNT NO.:  [id number]   MEDICAL RECORD NO.:  [id number]          PATIENT TYPE:  AMB   LOCATION:  DSC                          FACILITY:  [facility]   PHYSICIAN:  Wilmon Arms. Corliss Skains, M.D. DATE OF BIRTH:  Jun 20, 1941   DATE OF PROCEDURE:  05/17/2006  DATE OF DISCHARGE:                                 OPERATIVE REPORT   PREOPERATIVE DIAGNOSIS:  Recurrent right inguinal hernia.   POSTOPERATIVE DIAGNOSIS:  Recurrent right inguinal hernia.   PROCEDURE PERFORMED:  Right inguinal hernia repair with mesh.   SURGEON:  Wilmon Arms. Tsuei, M.D.   ANESTHESIA:  General via LMA.   INDICATIONS:  The patient is a 70 year old female who is in excellent health  and is quite active, who underwent a right inguinal hernia repair with mesh  on March 07, 2006.  The patient had virtually no pain postoperatively and  resumed her level of activity almost immediately.  At that time, her hernia  had been repaired with a keyhole mesh secured with interrupted sutures.  Postoperatively, the patient developed some swelling in this area.  This  swelling did not go away and it was determined to be a recurrent hernia.  She presents now for repair.   DESCRIPTION OF PROCEDURE:  The patient was given preoperative antibiotics.  She was brought to the operating room, placed in a supine position on  operating room table.  After an adequate level of general anesthesia was  obtained, the patient's right groin was shaved, prepped with Betadine and  draped in sterile fashion.  A time-out was taken ensure the proper patient  and proper procedure.  The area around her previous inguinal incision was  infiltrated 0.25% Marcaine.  The incision was opened with a scalpel.  Dissection was carried down through the subcutaneous scar tissue.  The  external oblique fascia was identified.  The previous suture line was  reopened with cautery.  The mesh was palpated and it appeared to have pulled  loose  inferiorly and migrated superiorly.  There was a large direct defect  that was noted.  The inferior epigastric vessels were seen in the medial  part of this hernia defect.  The surrounding muscle appeared to be intact.  We then repaired the large direct hernia with a 3 inches x 6 inches of  Marlex mesh.  This was cut into a cone shape.  This was sutured beginning at  the pubic tubercle.  We ran the 2-0 Prolene suture along Cooper's ligament  inferiorly.  This was secured to the internal oblique fascia superiorly.  We  continued laterally until we were completely around the large direct defect.  The tails of the mesh were tucked underneath the external oblique fascia.  The wound was then thoroughly irrigated.  No bleeding was noted.  The fascia  was reapproximated with a zero Vicryl suture.  3-0 Vicryl was used to close  the subcutaneous tissues and 4-0  Monocryl was used to close the skin in subcuticular fashion.  Steri-Strips  and clean dressings were applied.  The patient was then extubated and  brought to the  recovery room in stable condition.  All sponge, instrument  and needle counts were correct.      Wilmon Arms. Tsuei, M.D.  Electronically Signed     MKT/MEDQ  D:  05/17/2006  T:  05/18/2006  Job:  [id number]

## 2011-03-14 ENCOUNTER — Other Ambulatory Visit (HOSPITAL_COMMUNITY): Payer: Self-pay | Admitting: Obstetrics and Gynecology

## 2011-03-14 DIAGNOSIS — Z1231 Encounter for screening mammogram for malignant neoplasm of breast: Secondary | ICD-10-CM

## 2011-04-13 ENCOUNTER — Ambulatory Visit (HOSPITAL_COMMUNITY)
Admission: RE | Admit: 2011-04-13 | Discharge: 2011-04-13 | Disposition: A | Discharge status: Home / Self Care | Payer: Medicare Other | Source: Ambulatory Visit | Attending: Obstetrics and Gynecology | Admitting: Obstetrics and Gynecology

## 2011-04-13 DIAGNOSIS — Z1231 Encounter for screening mammogram for malignant neoplasm of breast: Secondary | ICD-10-CM

## 2011-10-01 ENCOUNTER — Emergency Department (HOSPITAL_COMMUNITY)
Admission: EM | Admit: 2011-10-01 | Discharge: 2011-10-02 | Disposition: A | Discharge status: Home / Self Care | Payer: Medicare Other | Attending: Emergency Medicine | Admitting: Emergency Medicine

## 2011-10-01 ENCOUNTER — Emergency Department (HOSPITAL_COMMUNITY): Payer: Medicare Other

## 2011-10-01 ENCOUNTER — Other Ambulatory Visit: Payer: Self-pay

## 2011-10-01 ENCOUNTER — Encounter (HOSPITAL_COMMUNITY): Payer: Self-pay | Admitting: *Deleted

## 2011-10-01 DIAGNOSIS — R202 Paresthesia of skin: Secondary | ICD-10-CM

## 2011-10-01 DIAGNOSIS — R209 Unspecified disturbances of skin sensation: Secondary | ICD-10-CM | POA: Insufficient documentation

## 2011-10-01 DIAGNOSIS — M79609 Pain in unspecified limb: Secondary | ICD-10-CM | POA: Insufficient documentation

## 2011-10-01 DIAGNOSIS — Z96649 Presence of unspecified artificial hip joint: Secondary | ICD-10-CM | POA: Insufficient documentation

## 2011-10-01 DIAGNOSIS — R51 Headache: Secondary | ICD-10-CM | POA: Insufficient documentation

## 2011-10-01 LAB — POCT I-STAT, CHEM 8
BUN: 14 mg/dL (ref 6–23)
Calcium, Ion: 1.23 mmol/L (ref 1.12–1.32)
Chloride: 105 mEq/L (ref 96–112)
HCT: 34 % — ABNORMAL LOW (ref 36.0–46.0)
Potassium: 3.8 mEq/L (ref 3.5–5.1)
Sodium: 142 mEq/L (ref 135–145)

## 2011-10-01 LAB — URINE MICROSCOPIC-ADD ON

## 2011-10-01 LAB — DIFFERENTIAL
Basophils Absolute: 0 10*3/uL (ref 0.0–0.1)
Lymphocytes Relative: 39 % (ref 12–46)
Lymphs Abs: 1.7 10*3/uL (ref 0.7–4.0)
Neutro Abs: 2.1 10*3/uL (ref 1.7–7.7)
Neutrophils Relative %: 46 % (ref 43–77)

## 2011-10-01 LAB — URINALYSIS, ROUTINE W REFLEX MICROSCOPIC
Glucose, UA: NEGATIVE mg/dL
Ketones, ur: NEGATIVE mg/dL
Nitrite: NEGATIVE
Specific Gravity, Urine: 1.02 (ref 1.005–1.030)
pH: 7.5 (ref 5.0–8.0)

## 2011-10-01 LAB — CBC
MCV: 93.3 fL (ref 78.0–100.0)
Platelets: 234 10*3/uL (ref 150–400)
RBC: 3.58 MIL/uL — ABNORMAL LOW (ref 3.87–5.11)
RDW: 13.9 % (ref 11.5–15.5)
WBC: 4.5 10*3/uL (ref 4.0–10.5)

## 2011-10-01 LAB — POCT I-STAT TROPONIN I: Troponin i, poc: 0 ng/mL (ref 0.00–0.08)

## 2011-10-01 MED ORDER — SODIUM CHLORIDE 0.9 % IV SOLN
INTRAVENOUS | Status: DC
  Administered 2011-10-01: 23:00:00 via INTRAVENOUS

## 2011-10-01 NOTE — Discharge Instructions (Signed)
Followup with Dr. Zachery Dauer for further management and evaluation of the symptoms.

## 2011-10-01 NOTE — ED Provider Notes (Signed)
History     CSN: [id number]  Arrival date & time 10/01/11  2128   First MD Initiated Contact with Patient 10/01/11 2209      Chief Complaint  Patient presents with  . Numbness  . Extremity Weakness    (Consider location/radiation/quality/duration/timing/severity/associated sxs/prior treatment) Patient is a 71 y.o. female presenting with extremity weakness. The history is provided by the patient.  Extremity Weakness Associated symptoms include headaches. Pertinent negatives include no chest pain, no abdominal pain and no shortness of breath.   patient states that she has had episodes of left sided lower face numbness it lasted a couple weeks. Comes and goes. It is just from her eye down. It is just a decreased sensation. There is no weakness to it. She also states that she's had some pain and mild numbness to her left upper extremity. She states it is his shoulder just below her elbow. She also states she's had some generalized weakness. She is able to do her regular exercise routine no. She also states she's had some pins and needles in her bilateral lower extremities and bilateral upper extremities. She's awake and appropriate. She states she's also had a new and different headache. She comes and goes. It is sometimes associated with facial numbness, but not always. The trauma. No loss. No fevers. No cough. No dysuria. No chest pain.  History reviewed. No pertinent past medical history.  Past Surgical History  Procedure Date  . Joint replacement     hip    No family history on file.  History  Substance Use Topics  . Smoking status: Not on file  . Smokeless tobacco: Not on file  . Alcohol Use:     OB History    Grav Para Term Preterm Abortions TAB SAB Ect Mult Living                  Review of Systems  Constitutional: Negative for activity change, appetite change and fatigue.  HENT: Negative for neck stiffness.   Eyes: Negative for pain.  Respiratory: Negative for  chest tightness and shortness of breath.   Cardiovascular: Negative for chest pain and leg swelling.  Gastrointestinal: Negative for nausea, vomiting, abdominal pain and diarrhea.  Genitourinary: Negative for flank pain.  Musculoskeletal: Positive for extremity weakness. Negative for back pain.  Skin: Negative for rash.  Neurological: Positive for weakness, numbness and headaches.  Psychiatric/Behavioral: Negative for behavioral problems.    Allergies  Review of patient's allergies indicates no known allergies.  Home Medications   Current Outpatient Rx  Name Route Sig Dispense Refill  . CALCIUM CARBONATE-VITAMIN D 500-200 MG-UNIT PO TABS Oral Take 2 tablets by mouth 2 (two) times daily.    . OMEGA-3-ACID ETHYL ESTERS 1 G PO CAPS Oral Take 1 g by mouth daily.    . TRAVOPROST (BAK FREE) 0.004 % OP SOLN Both Eyes Place 1 drop into both eyes at bedtime.    Marland Kitchen VITAMIN C 500 MG PO TABS Oral Take 1,000 mg by mouth daily.      BP 135/67  Pulse 81  Temp(Src) 97.8 F (36.6 C) (Oral)  Resp 18  Ht 5\' 3"  (1.6 m)  Wt 110 lb (49.896 kg)  BMI 19.49 kg/m2  SpO2 100%  Physical Exam  Nursing note and vitals reviewed. Constitutional: She is oriented to person, place, and time. She appears well-developed and well-nourished.  HENT:  Head: Normocephalic and atraumatic.  Eyes: EOM are normal. Pupils are equal, round, and reactive to light.  Neck: Normal range of motion. Neck supple.  Cardiovascular: Normal rate, regular rhythm and normal heart sounds.   No murmur heard. Pulmonary/Chest: Effort normal and breath sounds normal. No respiratory distress. She has no wheezes. She has no rales.  Abdominal: Soft. Bowel sounds are normal. She exhibits no distension. There is no tenderness. There is no rebound and no guarding.  Musculoskeletal: Normal range of motion.       Nontender to left forearm and upper arm  Neurological: She is alert and oriented to person, place, and time. No cranial nerve  deficit.       Mildly decreased sensation to left face from the eye down. Extraocular movements intact. Sensation is present in the face. Strength is intact.  Skin: Skin is warm and dry.  Psychiatric: She has a normal mood and affect. Her speech is normal.    ED Course  Procedures (including critical care time)  Labs Reviewed  CBC - Abnormal; Notable for the following:    RBC 3.58 (*)    Hemoglobin 11.2 (*)    HCT 33.4 (*)    All other components within normal limits  DIFFERENTIAL - Abnormal; Notable for the following:    Monocytes Relative 13 (*)    All other components within normal limits  URINALYSIS, ROUTINE W REFLEX MICROSCOPIC - Abnormal; Notable for the following:    APPearance TURBID (*)    Leukocytes, UA SMALL (*)    All other components within normal limits  POCT I-STAT, CHEM 8 - Abnormal; Notable for the following:    Glucose, Bld 106 (*)    Hemoglobin 11.6 (*)    HCT 34.0 (*)    All other components within normal limits  URINE MICROSCOPIC-ADD ON - Abnormal; Notable for the following:    Bacteria, UA MANY (*)    All other components within normal limits  POCT I-STAT TROPONIN I  URINE CULTURE   Dg Chest 2 View  10/01/2011  *RADIOLOGY REPORT*  Clinical Data: Left arm pain.  CHEST - 2 VIEW  Comparison: 08/25/2010  Findings: Slight peribronchial thickening.  Findings are similar to prior study. Heart and mediastinal contours are within normal limits.  No focal opacities or effusions.  No acute bony abnormality.  IMPRESSION: Slight bronchitic changes, stable.  Original Report Authenticated By: Cyndie Chime, M.D.   Ct Head Wo Contrast  10/01/2011  *RADIOLOGY REPORT*  Clinical Data: 71 year old female with weakness.  Left facial numbness.  CT HEAD WITHOUT CONTRAST  Technique:  Contiguous axial images were obtained from the base of the skull through the vertex without contrast.  Comparison: None.  Findings: Left sphenoid sinus mucoperiosteal thickening.  Anterior ethmoid  opacification.  Other Visualized paranasal sinuses and mastoids are clear.  Visualized orbits and scalp soft tissues are within normal limits.  No acute osseous abnormality identified.  Cerebral volume is within normal limits for age.  No ventriculomegaly. No midline shift, mass effect, or evidence of mass lesion.  No acute intracranial hemorrhage identified.  No evidence of cortically based acute infarction identified. Questionable asymmetric hypodensity in the left thalamus. Otherwise normal for age gray-white matter differentiation. No suspicious intracranial vascular hyperdensity.  IMPRESSION: Questionable hypodensity in the left thalamus such as due to small vessel ischemia (right side symptoms would result). No acute intracranial abnormality.  Original Report Authenticated By: Ulla Potash III, M.D.     1. Paresthesias       MDM  Patient's had numbness in her left lower face. She's also had some pain  in her left arm. The pain comes and goes. It is not associated with her exertions. She's also had various paresthesias to arms and legs. Lab work is reassuring. Head CT and x-ray reassuring. I doubt acute CVA with the distribution. Doubt cardiac cause. Patient does have bacteria in the urine, but no white cells. Culture was sent. She'll follow with her primary care Dr.        Juliet Rude. Rubin Payor, MD 10/01/11 [phone]

## 2011-10-01 NOTE — ED Notes (Signed)
Pt states that she began to notice left sided facial numbness yesterday and mild left arm weakness.  Pt states that this morning both numbness and weakness sensations were gone and she walked her usual 2 miles at the [facility] this morning and continued about her daily routine.  This evening, the pt again noticed some left sided facial numbness and a "pins and needles" sensation in her left arm.  Pt c/o the same "pins and needles" sensation that is present intermittently in bilateral arms and legs.  Pt is able to recall recent and distant past with specific dates and names.  Pt exhibits no neurologic deficiencies or any weakness.

## 2012-03-13 ENCOUNTER — Other Ambulatory Visit (HOSPITAL_COMMUNITY): Payer: Self-pay | Admitting: Obstetrics and Gynecology

## 2012-03-13 DIAGNOSIS — Z1231 Encounter for screening mammogram for malignant neoplasm of breast: Secondary | ICD-10-CM

## 2012-04-13 ENCOUNTER — Ambulatory Visit (HOSPITAL_COMMUNITY)
Admission: RE | Admit: 2012-04-13 | Discharge: 2012-04-13 | Disposition: A | Discharge status: Home / Self Care | Payer: Medicare Other | Source: Ambulatory Visit | Attending: Obstetrics and Gynecology | Admitting: Obstetrics and Gynecology

## 2012-04-13 DIAGNOSIS — Z1231 Encounter for screening mammogram for malignant neoplasm of breast: Secondary | ICD-10-CM | POA: Insufficient documentation

## 2013-03-11 ENCOUNTER — Other Ambulatory Visit (HOSPITAL_COMMUNITY): Payer: Self-pay | Admitting: Obstetrics and Gynecology

## 2013-03-11 DIAGNOSIS — Z1231 Encounter for screening mammogram for malignant neoplasm of breast: Secondary | ICD-10-CM

## 2013-04-15 ENCOUNTER — Ambulatory Visit (HOSPITAL_COMMUNITY)
Admission: RE | Admit: 2013-04-15 | Discharge: 2013-04-15 | Disposition: A | Discharge status: Home / Self Care | Payer: Medicare Other | Source: Ambulatory Visit | Attending: Obstetrics and Gynecology | Admitting: Obstetrics and Gynecology

## 2013-04-15 DIAGNOSIS — Z1231 Encounter for screening mammogram for malignant neoplasm of breast: Secondary | ICD-10-CM | POA: Insufficient documentation

## 2013-07-12 ENCOUNTER — Encounter: Payer: Self-pay | Admitting: General Surgery

## 2013-07-12 DIAGNOSIS — I359 Nonrheumatic aortic valve disorder, unspecified: Secondary | ICD-10-CM

## 2013-07-12 DIAGNOSIS — Z0389 Encounter for observation for other suspected diseases and conditions ruled out: Secondary | ICD-10-CM

## 2013-07-17 ENCOUNTER — Encounter: Payer: Self-pay | Admitting: Cardiology

## 2013-07-17 ENCOUNTER — Ambulatory Visit (INDEPENDENT_AMBULATORY_CARE_PROVIDER_SITE_OTHER): Payer: Medicare Other | Admitting: Cardiology

## 2013-07-17 ENCOUNTER — Encounter: Payer: Self-pay | Admitting: General Surgery

## 2013-07-17 VITALS — BP 118/68 | HR 70 | Ht 62.0 in | Wt 105.0 lb

## 2013-07-17 DIAGNOSIS — R0609 Other forms of dyspnea: Secondary | ICD-10-CM

## 2013-07-17 DIAGNOSIS — I359 Nonrheumatic aortic valve disorder, unspecified: Secondary | ICD-10-CM

## 2013-07-17 DIAGNOSIS — R0989 Other specified symptoms and signs involving the circulatory and respiratory systems: Secondary | ICD-10-CM

## 2013-07-17 DIAGNOSIS — I351 Nonrheumatic aortic (valve) insufficiency: Secondary | ICD-10-CM | POA: Insufficient documentation

## 2013-07-17 DIAGNOSIS — R0600 Dyspnea, unspecified: Secondary | ICD-10-CM

## 2013-07-17 NOTE — Patient Instructions (Signed)
Your physician recommends that you continue on your current medications as directed. Please refer to the Current Medication list given to you today.  Your physician has requested that you have en exercise stress myoview. For further information please visit [email or web address] Please follow instruction sheet, as given.  Your physician has requested that you have an echocardiogram. Echocardiography is a painless test that uses sound waves to create images of your heart. It provides your doctor with information about the size and shape of your heart and how well your heart's chambers and valves are working. This procedure takes approximately one hour. There are no restrictions for this procedure.  Your physician wants you to follow-up in: 1 Year with Dr Sherlyn Lick will receive a reminder letter in the mail two months in advance. If you don't receive a letter, please call our office to schedule the follow-up appointment.

## 2013-07-17 NOTE — Progress Notes (Signed)
  [address] [place], [place]  [address] Phone: [phone] Fax:  [phone]  Date:  07/17/2013   ID:  Kelly Grimes, Kelly Grimes 1940-09-03, MRN [id number]  PCP:  Gaye Alken, MD  Cardiologist:  Armanda Magic, MD     History of Present Illness: Kelly Grimes is a 72 y.o. female with a history of mild to moderate AR presents today for followup.  She denies any chest pain but when she is walking briskly up a hill she may notice some DOE occasionally.  She denies any LE edema, dizziness, palpitations or syncope.  Occasionally when she gets up first thing in the am she may feel a little off balance with the room.  Wt Readings from Last 3 Encounters:  07/17/13 105 lb (47.628 kg)  07/12/13 107 lb 6.4 oz (48.716 kg)  10/01/11 110 lb (49.896 kg)     Past Medical History  Diagnosis Date  . Osteopenia   . Glaucoma     Pre glaucoma Dr. Charlotte Sanes  . Hyperlipidemia   . Melanoma 1993    Left arm Dr Terri Piedra  . Mild aortic regurgitation 12/11    Mild mitral regurgitation by ECHO   . Headache     Dr. Terrace Arabia    Current Outpatient Prescriptions  Medication Sig Dispense Refill  . calcium-vitamin D (OSCAL WITH D) 500-200 MG-UNIT per tablet Take 2 tablets by mouth 2 (two) times daily.      Marland Kitchen omega-3 acid ethyl esters (LOVAZA) 1 G capsule Take 1 g by mouth daily.      . Travoprost, BAK Free, (TRAVATAN) 0.004 % SOLN ophthalmic solution Place 1 drop into both eyes at bedtime.      . vitamin C (ASCORBIC ACID) 500 MG tablet Take 1,000 mg by mouth daily.       No current facility-administered medications for this visit.    Allergies:   No Known Allergies  Social History:  The patient  reports that she has never smoked. She does not have any smokeless tobacco history on file. She reports that she does not drink alcohol or use illicit drugs.   Family History:  The patient's family history is not on file.   ROS:  Please see the history of present illness.      All other  systems reviewed and negative.   PHYSICAL EXAM: VS:  BP 118/68  Pulse 70  Ht 5\' 2"  (1.575 m)  Wt 105 lb (47.628 kg)  BMI 19.20 kg/m2 Well nourished, well developed, in no acute distress HEENT: normal Neck: no JVD Cardiac:  normal S1, S2; RRR; no murmur Lungs:  clear to auscultation bilaterally, no wheezing, rhonchi or rales Abd: soft, nontender, no hepatomegaly Ext: no edema Skin: warm and dry Neuro:  CNs 2-12 intact, no focal abnormalities noted  EKG:  NSR with no ST changes     ASSESSMENT AND PLAN:  1. Mild to moderate AI by echo 2 year ago - she is now having some DOE so I will repeat the scan to make sure it has not worsened 2. DOE which may be due to her walking fast but need to consider worsening AI or coronary ischemia  - I will get a stress myoview to rule out ischemia  Followup with me in 1 year.  Signed, Armanda Magic, MD 07/17/2013 10:25 AM

## 2013-08-02 ENCOUNTER — Ambulatory Visit (HOSPITAL_COMMUNITY): Payer: Medicare Other | Attending: Cardiology | Admitting: Cardiology

## 2013-08-02 ENCOUNTER — Encounter: Payer: Self-pay | Admitting: Cardiology

## 2013-08-02 DIAGNOSIS — E785 Hyperlipidemia, unspecified: Secondary | ICD-10-CM | POA: Insufficient documentation

## 2013-08-02 DIAGNOSIS — I379 Nonrheumatic pulmonary valve disorder, unspecified: Secondary | ICD-10-CM | POA: Insufficient documentation

## 2013-08-02 DIAGNOSIS — R0609 Other forms of dyspnea: Secondary | ICD-10-CM | POA: Insufficient documentation

## 2013-08-02 DIAGNOSIS — I351 Nonrheumatic aortic (valve) insufficiency: Secondary | ICD-10-CM

## 2013-08-02 DIAGNOSIS — R0600 Dyspnea, unspecified: Secondary | ICD-10-CM

## 2013-08-02 DIAGNOSIS — I079 Rheumatic tricuspid valve disease, unspecified: Secondary | ICD-10-CM | POA: Insufficient documentation

## 2013-08-02 DIAGNOSIS — I359 Nonrheumatic aortic valve disorder, unspecified: Secondary | ICD-10-CM

## 2013-08-02 DIAGNOSIS — R0989 Other specified symptoms and signs involving the circulatory and respiratory systems: Secondary | ICD-10-CM | POA: Insufficient documentation

## 2013-08-02 NOTE — Progress Notes (Signed)
Echo performed. 

## 2013-08-07 ENCOUNTER — Telehealth: Payer: Self-pay | Admitting: General Surgery

## 2013-08-07 ENCOUNTER — Encounter: Payer: Self-pay | Admitting: Cardiology

## 2013-08-07 ENCOUNTER — Ambulatory Visit (HOSPITAL_COMMUNITY): Payer: Medicare Other | Attending: Cardiology | Admitting: Radiology

## 2013-08-07 VITALS — BP 127/75 | Ht 62.0 in | Wt 102.0 lb

## 2013-08-07 DIAGNOSIS — I359 Nonrheumatic aortic valve disorder, unspecified: Secondary | ICD-10-CM

## 2013-08-07 DIAGNOSIS — R0609 Other forms of dyspnea: Secondary | ICD-10-CM | POA: Insufficient documentation

## 2013-08-07 DIAGNOSIS — R0602 Shortness of breath: Secondary | ICD-10-CM

## 2013-08-07 DIAGNOSIS — R0989 Other specified symptoms and signs involving the circulatory and respiratory systems: Secondary | ICD-10-CM

## 2013-08-07 DIAGNOSIS — R0600 Dyspnea, unspecified: Secondary | ICD-10-CM

## 2013-08-07 MED ORDER — TECHNETIUM TC 99M SESTAMIBI GENERIC - CARDIOLITE
30.0000 | Freq: Once | INTRAVENOUS | Status: AC | PRN
  Administered 2013-08-07: 30 via INTRAVENOUS

## 2013-08-07 MED ORDER — TECHNETIUM TC 99M SESTAMIBI GENERIC - CARDIOLITE
10.0000 | Freq: Once | INTRAVENOUS | Status: AC | PRN
  Administered 2013-08-07: 10 via INTRAVENOUS

## 2013-08-07 NOTE — Telephone Encounter (Signed)
Message copied by Nita Sells on Wed Aug 07, 2013 11:52 AM ------      Message from: Armanda Magic R      Created: Tue Aug 06, 2013  4:53 PM       Please let patient know that echo showed normal LVF with increased stiffness of heart muscle and moderately leaky AV - repeat echo in 1 year for AI ------

## 2013-08-07 NOTE — Progress Notes (Signed)
[facility] SITE 3 NUCLEAR MED [address] [place], [place] [address] [phone]    Cardiology Nuclear Med Study  Kelly Grimes is a 72 y.o. female     MRN : [id number]     DOB: 14-Apr-1941  Procedure Date: 08/07/2013  Nuclear Med Background Indication for Stress Test:  Evaluation for Ischemia History:  Echo 12 EF 67%; No Known History od CAD Cardiac Risk Factors: Lipids  Symptoms:  DOE   Nuclear Pre-Procedure Caffeine/Decaff Intake:  None > 12 hrs NPO After: 7:30pm   Lungs:  clear O2 Sat: 95% on room air. IV 0.9% NS with Angio Cath:  20g  IV Site: R Antecubital x 1, tolerated well IV Started by:  Irean Hong, RN  Chest Size (in):  34 Cup Size: B  Height: 5\' 2"  (1.575 m)  Weight:  102 lb (46.267 kg)  BMI:  Body mass index is 18.65 kg/(m^2). Tech Comments:  N/A    Nuclear Med Study 1 or 2 day study: 1 day  Stress Test Type:  Stress  Reading MD: Armanda Magic, MD  Order Authorizing Provider:  Armanda Magic, MD  Resting Radionuclide: Technetium 99m Sestamibi  Resting Radionuclide Dose: 11.0 mCi   Stress Radionuclide:  Technetium 71m Sestamibi  Stress Radionuclide Dose: 33.0 mCi           Stress Protocol Rest HR: 78 Stress HR: 139/79  Rest BP: 127/75 Stress BP: 131/90  Exercise Time (min): 3:29 METS: 4.60   Predicted Max HR: 149 bpm % Max HR: 89.26 bpm Rate Pressure Product: 21308   Dose of Adenosine (mg):  n/a Dose of Lexiscan: n/a mg  Dose of Atropine (mg): n/a Dose of Dobutamine: n/a mcg/kg/min (at max HR)  Stress Test Technologist: Frederick Peers, EMT-P  Nuclear Technologist:  Domenic Polite, CNMT     Rest Procedure:  Myocardial perfusion imaging was performed at rest 45 minutes following the intravenous administration of Technetium 22m Sestamibi. Rest ECG: NSR - Normal EKG  Stress Procedure:  The patient exercised on the treadmill utilizing the Bruce Protocol for 3:29 minutes. The patient stopped due to sob and denied any chest pain.   Technetium 72m Sestamibi was injected at peak exercise and myocardial perfusion imaging was performed after a brief delay. Stress ECG: No significant change from baseline ECG  QPS Raw Data Images:  Mild breast attenuation.  Normal left ventricular size. Stress Images:  Normal homogeneous uptake in all areas of the myocardium. Rest Images:  Normal homogeneous uptake in all areas of the myocardium. Subtraction (SDS):  No evidence of ischemia. Transient Ischemic Dilatation (Normal <1.22):  0.99 Lung/Heart Ratio (Normal <0.45):  0.23  Quantitative Gated Spect Images QGS EDV:  72 ml QGS ESV:  21 ml  Impression Exercise Capacity:  Below average exercise capacity. BP Response:  Normal blood pressure response. Clinical Symptoms:  There is dyspnea. ECG Impression:  No significant ST segment change suggestive of ischemia. Comparison with Prior Nuclear Study: No images to compare  Overall Impression:  Low risk stress nuclear study . No evidence of ischemia.. Below average exercise capacity.  LV Ejection Fraction: 70%.  LV Wall Motion:  NL LV Function; NL Wall Motion  Corky Crafts., MD, [facility]

## 2013-08-12 ENCOUNTER — Telehealth: Payer: Self-pay | Admitting: Cardiology

## 2013-08-12 NOTE — Telephone Encounter (Signed)
Pt is aware.  

## 2013-08-12 NOTE — Telephone Encounter (Signed)
PLease let patient know that her stress test was normal

## 2013-08-12 NOTE — Telephone Encounter (Signed)
LVM for pt to return call

## 2013-11-13 ENCOUNTER — Encounter (HOSPITAL_COMMUNITY): Payer: Self-pay | Admitting: Emergency Medicine

## 2013-11-13 ENCOUNTER — Emergency Department (HOSPITAL_COMMUNITY): Payer: Medicare Other

## 2013-11-13 ENCOUNTER — Emergency Department (HOSPITAL_COMMUNITY)
Admission: EM | Admit: 2013-11-13 | Discharge: 2013-11-13 | Disposition: A | Discharge status: Home / Self Care | Payer: Medicare Other | Attending: Emergency Medicine | Admitting: Emergency Medicine

## 2013-11-13 DIAGNOSIS — S52509A Unspecified fracture of the lower end of unspecified radius, initial encounter for closed fracture: Secondary | ICD-10-CM

## 2013-11-13 DIAGNOSIS — Z85820 Personal history of malignant melanoma of skin: Secondary | ICD-10-CM | POA: Insufficient documentation

## 2013-11-13 DIAGNOSIS — S52599A Other fractures of lower end of unspecified radius, initial encounter for closed fracture: Secondary | ICD-10-CM | POA: Insufficient documentation

## 2013-11-13 DIAGNOSIS — Y93H2 Activity, gardening and landscaping: Secondary | ICD-10-CM | POA: Insufficient documentation

## 2013-11-13 DIAGNOSIS — I359 Nonrheumatic aortic valve disorder, unspecified: Secondary | ICD-10-CM | POA: Insufficient documentation

## 2013-11-13 DIAGNOSIS — M899 Disorder of bone, unspecified: Secondary | ICD-10-CM | POA: Insufficient documentation

## 2013-11-13 DIAGNOSIS — M949 Disorder of cartilage, unspecified: Secondary | ICD-10-CM

## 2013-11-13 DIAGNOSIS — Z79899 Other long term (current) drug therapy: Secondary | ICD-10-CM | POA: Insufficient documentation

## 2013-11-13 DIAGNOSIS — W010XXA Fall on same level from slipping, tripping and stumbling without subsequent striking against object, initial encounter: Secondary | ICD-10-CM | POA: Insufficient documentation

## 2013-11-13 DIAGNOSIS — Y92009 Unspecified place in unspecified non-institutional (private) residence as the place of occurrence of the external cause: Secondary | ICD-10-CM | POA: Insufficient documentation

## 2013-11-13 DIAGNOSIS — H409 Unspecified glaucoma: Secondary | ICD-10-CM | POA: Insufficient documentation

## 2013-11-13 NOTE — ED Notes (Signed)
Pt states that she was about mow her lawn, was turning it, tripped over her blower, broke her fall with her right hand.

## 2013-11-13 NOTE — ED Notes (Signed)
Ortho called 

## 2013-11-13 NOTE — ED Provider Notes (Signed)
CSN: [id number]     Arrival date & time 11/13/13  1656 History   First MD Initiated Contact with Patient 11/13/13 1900     Chief Complaint  Patient presents with  . Fall  . Wrist Pain     (Consider location/radiation/quality/duration/timing/severity/associated sxs/prior Treatment) Patient is a 73 y.o. female presenting with fall and wrist pain. The history is provided by the patient.  Fall This is a new problem.  Wrist Pain   patient was going outside to in the lawn and tripped, landing on her right wrist. There is deformity. No other injury. No numbness weakness.  Past Medical History  Diagnosis Date  . Osteopenia   . Glaucoma     Pre glaucoma Dr. Ellie Lunch  . Hyperlipidemia   . Melanoma 1993    Left arm Dr Allyson Sabal  . Mild aortic regurgitation 12/11    Mild mitral regurgitation by ECHO   . Headache     Dr. Krista Blue   Past Surgical History  Procedure Laterality Date  . Joint replacement      hip   Family History  Problem Relation Age of Onset  . Alzheimer's disease Mother   . Pancreatic cancer Father    History  Substance Use Topics  . Smoking status: Never Smoker   . Smokeless tobacco: Not on file  . Alcohol Use: No   OB History   Grav Para Term Preterm Abortions TAB SAB Ect Mult Living                 Review of Systems  Musculoskeletal: Negative for back pain, neck pain and neck stiffness.  Skin: Negative for wound.      Allergies  Review of patient's allergies indicates no known allergies.  Home Medications   Current Outpatient Rx  Name  Route  Sig  Dispense  Refill  . calcium-vitamin D (OSCAL WITH D) 500-200 MG-UNIT per tablet   Oral   Take 1 tablet by mouth 4 (four) times daily - after meals and at bedtime.          Marland Kitchen omega-3 acid ethyl esters (LOVAZA) 1 G capsule   Oral   Take 1 g by mouth daily.         . Travoprost, BAK Free, (TRAVATAN) 0.004 % SOLN ophthalmic solution   Both Eyes   Place 1 drop into both eyes at bedtime.         .  vitamin C (ASCORBIC ACID) 500 MG tablet   Oral   Take 1,000 mg by mouth daily.          BP 102/62  Pulse 92  Temp(Src) 98.6 F (37 C) (Oral)  Resp 16  SpO2 100% Physical Exam  Constitutional: She appears well-nourished.  Cardiovascular: Normal rate and regular rhythm.   Musculoskeletal:  Deformity of right wrist with some dorsal angulation. Tenderness over distal radius. Skin is intact. Sensation of her radial median and ulnar distribution of hand. Strong radial pulse. No tenderness or elbow. Range of motion intact elbow.  Neurological: She is alert.  Skin: Skin is warm.    ED Course  Procedures (including critical care time) Labs Review Labs Reviewed - No data to display Imaging Review Dg Wrist Complete Right  11/13/2013   CLINICAL DATA:  Right wrist pain after fall.  EXAM: RIGHT WRIST - COMPLETE 3+ VIEW  COMPARISON:  None.  FINDINGS: Mildly posteriorly displaced and comminuted fracture is seen involving the distal radius with intra-articular extension. Osteoarthritis of the first carpometacarpal  joint is noted.  IMPRESSION: Posteriorly displaced and comminuted distal radial fracture with intra-articular extension.   Electronically Signed   By: Sabino Dick M.D.   On: 11/13/2013 18:06     EKG Interpretation None      MDM   Final diagnoses:  Distal radius fracture    Patient with intra-articular distal radius fracture. Skin is closed. Patient has seen [facility] orthopedics before like to followup with them. I discussed with Dr. Wynelle Link. Will splint and have follow with Hand    Avrianna Smart R. Alvino Chapel, MD 11/13/13 2115

## 2013-11-13 NOTE — Discharge Instructions (Signed)
Radial Fracture You have a broken bone (fracture) of the forearm. This is the part of your arm between the elbow and your wrist. Your forearm is made up of two bones. These are the radius and ulna. Your fracture is in the radial shaft. This is the bone in your forearm located on the thumb side. A cast or splint is used to protect and keep your injured bone from moving. The cast or splint will be on generally for about 5 to 6 weeks, with individual variations. HOME CARE INSTRUCTIONS   Keep the injured part elevated while sitting or lying down. Keep the injury above the level of your heart (the center of the chest). This will decrease swelling and pain.  Apply ice to the injury for 15-20 minutes, 03-04 times per day while awake, for 2 days. Put the ice in a plastic bag and place a towel between the bag of ice and your cast or splint.  Move your fingers to avoid stiffness and minimize swelling.  If you have a plaster or fiberglass cast:  Do not try to scratch the skin under the cast using sharp or pointed objects.  Check the skin around the cast every day. You may put lotion on any red or sore areas.  Keep your cast dry and clean.  If you have a plaster splint:  Wear the splint as directed.  You may loosen the elastic around the splint if your fingers become numb, tingle, or turn cold or blue.  Do not put pressure on any part of your cast or splint. It may break. Rest your cast only on a pillow for the first 24 hours until it is fully hardened.  Your cast or splint can be protected during bathing with a plastic bag. Do not lower the cast or splint into water.  Only take over-the-counter or prescription medicines for pain, discomfort, or fever as directed by your caregiver. SEEK IMMEDIATE MEDICAL CARE IF:   Your cast gets damaged or breaks.  You have more severe pain or swelling than you did before getting the cast.  You have severe pain when stretching your fingers.  There is a bad  smell, new stains and/or pus-like (purulent) drainage coming from under the cast.  Your fingers or hand turn pale or blue and become cold or your loose feeling. Document Released: 01/05/2006 Document Revised: 10/17/2011 Document Reviewed: 04/03/2006 [employer] Patient Information 2014 [employer].  

## 2013-11-14 ENCOUNTER — Encounter (HOSPITAL_COMMUNITY): Payer: Self-pay | Admitting: Pharmacy Technician

## 2013-11-14 NOTE — Pre-Procedure Instructions (Signed)
DI JASMER  11/14/2013   Your procedure is scheduled on: Saturday, November 16, 2013 at 12:25 PM  Report to The Emergency Department  at 10:25 AM.  Call this number if you have problems the morning of surgery: [phone]   Remember:   Do not eat food or drink liquids after midnight Friday, November 15, 2013   Take these medicines the morning of surgery with A SIP OF WATER: NONE   Do not wear jewelry, make-up or nail polish.  Do not wear lotions, powders, or perfumes. You may wear deodorant.  Do not shave 48 hours prior to surgery. Men may shave face and neck.  Do not bring valuables to the hospital.  [facility] is not responsible for any belongings or valuables.               Contacts, dentures or bridgework may not be worn into surgery.  Leave suitcase in the car. After surgery it may be brought to your room.  For patients admitted to the hospital, discharge time is determined by your treatment team.               Patients discharged the day of surgery will not be allowed to drive home.   Name and phone number of your driver: Sheryle Spray, [place]   Special Instructions:  Special Instructions:Special Instructions: [facility] - Preparing for Surgery  Before surgery, you can play an important role.  Because skin is not sterile, your skin needs to be as free of germs as possible.  You can reduce the number of germs on you skin by washing with CHG (chlorahexidine gluconate) soap before surgery.  CHG is an antiseptic cleaner which kills germs and bonds with the skin to continue killing germs even after washing.  Please DO NOT use if you have an allergy to CHG or antibacterial soaps.  If your skin becomes reddened/irritated stop using the CHG and inform your nurse when you arrive at Short Stay.  Do not shave (including legs and underarms) for at least 48 hours prior to the first CHG shower.  You may shave your face.  Please follow these instructions carefully:   1.  Shower with CHG  Soap the night before surgery and the morning of Surgery.  2.  If you choose to wash your hair, wash your hair first as usual with your normal shampoo.  3.  After you shampoo, rinse your hair and body thoroughly to remove the Shampoo.  4.  Use CHG as you would any other liquid soap.  You can apply chg directly  to the skin and wash gently with scrungie or a clean washcloth.  5.  Apply the CHG Soap to your body ONLY FROM THE NECK DOWN.  Do not use on open wounds or open sores.  Avoid contact with your eyes, ears, mouth and genitals (private parts).  Wash genitals (private parts) with your normal soap.  6.  Wash thoroughly, paying special attention to the area where your surgery will be performed.  7.  Thoroughly rinse your body with warm water from the neck down.  8.  DO NOT shower/wash with your normal soap after using and rinsing off the CHG Soap.  9.  Pat yourself dry with a clean towel.            10.  Wear clean pajamas.            11.  Place clean sheets on your bed the night of  your first shower and do not sleep with pets.  Day of Surgery  Do not apply any lotions the morning of surgery.  Please wear clean clothes to the hospital/surgery center.   Please read over the following fact sheets that you were given: Pain Booklet, Coughing and Deep Breathing and Surgical Site Infection Prevention

## 2013-11-15 ENCOUNTER — Encounter (HOSPITAL_COMMUNITY): Payer: Self-pay

## 2013-11-15 ENCOUNTER — Encounter (HOSPITAL_COMMUNITY)
Admission: RE | Admit: 2013-11-15 | Discharge: 2013-11-15 | Disposition: A | Discharge status: Home / Self Care | Payer: Medicare Other | Source: Ambulatory Visit | Attending: Orthopedic Surgery | Admitting: Orthopedic Surgery

## 2013-11-15 ENCOUNTER — Other Ambulatory Visit: Payer: Self-pay | Admitting: Orthopedic Surgery

## 2013-11-15 LAB — BASIC METABOLIC PANEL
BUN: 22 mg/dL (ref 6–23)
CHLORIDE: 107 meq/L (ref 96–112)
CO2: 24 mEq/L (ref 19–32)
Calcium: 9.3 mg/dL (ref 8.4–10.5)
Creatinine, Ser: 0.57 mg/dL (ref 0.50–1.10)
GFR calc Af Amer: >90 mL/min (ref >90)
GFR calc non Af Amer: >90 mL/min (ref >90)
Glucose, Bld: 50 mg/dL — ABNORMAL LOW (ref 70–99)
POTASSIUM: 4.6 meq/L (ref 3.7–5.3)
Sodium: 145 mEq/L (ref 137–147)

## 2013-11-15 LAB — CBC
HEMATOCRIT: 38.8 % (ref 36.0–46.0)
Hemoglobin: 12.8 g/dL (ref 12.0–15.0)
MCH: 31.5 pg (ref 26.0–34.0)
MCHC: 33 g/dL (ref 30.0–36.0)
MCV: 95.6 fL (ref 78.0–100.0)
PLATELETS: 197 10*3/uL (ref 150–400)
RBC: 4.06 MIL/uL (ref 3.87–5.11)
RDW: 14.3 % (ref 11.5–15.5)
WBC: 5.2 10*3/uL (ref 4.0–10.5)

## 2013-11-15 MED ORDER — CEFAZOLIN SODIUM-DEXTROSE 2-3 GM-% IV SOLR
2.0000 g | INTRAVENOUS | Status: AC
  Administered 2013-11-16: 2 g via INTRAVENOUS
  Filled 2013-11-15: qty 50

## 2013-11-15 MED ORDER — CHLORHEXIDINE GLUCONATE 4 % EX LIQD
60.0000 mL | Freq: Once | CUTANEOUS | Status: DC
  Filled 2013-11-15: qty 60

## 2013-11-15 NOTE — Progress Notes (Signed)
After reviewing labs, noticed that glucose was 50.  I called the patient and she stated she felt fine, and that prior to coming for PAT, she had pop tart & coffee.   I also called her PCP to let her know.  Otherwise, the rest of the labs came back 'normal'.  DA

## 2013-11-16 ENCOUNTER — Encounter (HOSPITAL_COMMUNITY): Payer: Self-pay | Admitting: *Deleted

## 2013-11-16 ENCOUNTER — Observation Stay (HOSPITAL_COMMUNITY)
Admission: RE | Admit: 2013-11-16 | Discharge: 2013-11-17 | Disposition: A | Discharge status: Home / Self Care | Payer: Medicare Other | Source: Ambulatory Visit | Attending: Orthopedic Surgery | Admitting: Orthopedic Surgery

## 2013-11-16 ENCOUNTER — Encounter (HOSPITAL_COMMUNITY)
Admission: RE | Disposition: A | Discharge status: Home / Self Care | Payer: Self-pay | Source: Ambulatory Visit | Attending: Orthopedic Surgery

## 2013-11-16 ENCOUNTER — Ambulatory Visit (HOSPITAL_COMMUNITY): Payer: Medicare Other | Admitting: Anesthesiology

## 2013-11-16 ENCOUNTER — Encounter (HOSPITAL_COMMUNITY): Payer: Medicare Other | Admitting: Anesthesiology

## 2013-11-16 DIAGNOSIS — H409 Unspecified glaucoma: Secondary | ICD-10-CM | POA: Insufficient documentation

## 2013-11-16 DIAGNOSIS — Y92009 Unspecified place in unspecified non-institutional (private) residence as the place of occurrence of the external cause: Secondary | ICD-10-CM | POA: Insufficient documentation

## 2013-11-16 DIAGNOSIS — Z85820 Personal history of malignant melanoma of skin: Secondary | ICD-10-CM | POA: Insufficient documentation

## 2013-11-16 DIAGNOSIS — M899 Disorder of bone, unspecified: Secondary | ICD-10-CM | POA: Insufficient documentation

## 2013-11-16 DIAGNOSIS — E785 Hyperlipidemia, unspecified: Secondary | ICD-10-CM | POA: Insufficient documentation

## 2013-11-16 DIAGNOSIS — Z96649 Presence of unspecified artificial hip joint: Secondary | ICD-10-CM | POA: Insufficient documentation

## 2013-11-16 DIAGNOSIS — W010XXA Fall on same level from slipping, tripping and stumbling without subsequent striking against object, initial encounter: Secondary | ICD-10-CM | POA: Insufficient documentation

## 2013-11-16 DIAGNOSIS — S52599A Other fractures of lower end of unspecified radius, initial encounter for closed fracture: Principal | ICD-10-CM | POA: Insufficient documentation

## 2013-11-16 DIAGNOSIS — S52509A Unspecified fracture of the lower end of unspecified radius, initial encounter for closed fracture: Secondary | ICD-10-CM | POA: Diagnosis present

## 2013-11-16 DIAGNOSIS — I359 Nonrheumatic aortic valve disorder, unspecified: Secondary | ICD-10-CM | POA: Insufficient documentation

## 2013-11-16 DIAGNOSIS — M949 Disorder of cartilage, unspecified: Secondary | ICD-10-CM

## 2013-11-16 DIAGNOSIS — R011 Cardiac murmur, unspecified: Secondary | ICD-10-CM | POA: Insufficient documentation

## 2013-11-16 HISTORY — PX: OPEN REDUCTION INTERNAL FIXATION (ORIF) DISTAL RADIAL FRACTURE: SHX5989

## 2013-11-16 SURGERY — OPEN REDUCTION INTERNAL FIXATION (ORIF) DISTAL RADIUS FRACTURE
Anesthesia: General | Site: Wrist | Laterality: Right

## 2013-11-16 MED ORDER — PROMETHAZINE HCL 25 MG/ML IJ SOLN: 6.2500 mg | INTRAMUSCULAR | Status: DC | PRN

## 2013-11-16 MED ORDER — METHOCARBAMOL 100 MG/ML IJ SOLN
500.0000 mg | Freq: Four times a day (QID) | INTRAMUSCULAR | Status: DC | PRN
  Filled 2013-11-16: qty 5

## 2013-11-16 MED ORDER — FAMOTIDINE 20 MG PO TABS
20.0000 mg | ORAL_TABLET | Freq: Two times a day (BID) | ORAL | Status: DC | PRN
  Filled 2013-11-16: qty 1

## 2013-11-16 MED ORDER — LIDOCAINE HCL (CARDIAC) 20 MG/ML IV SOLN
INTRAVENOUS | Status: DC | PRN
  Administered 2013-11-16: 30 mg via INTRAVENOUS

## 2013-11-16 MED ORDER — 0.9 % SODIUM CHLORIDE (POUR BTL) OPTIME
TOPICAL | Status: DC | PRN
  Administered 2013-11-16: 500 mL

## 2013-11-16 MED ORDER — ALPRAZOLAM 0.5 MG PO TABS: 0.5000 mg | ORAL_TABLET | Freq: Four times a day (QID) | ORAL | Status: DC | PRN

## 2013-11-16 MED ORDER — MORPHINE SULFATE 2 MG/ML IJ SOLN: 1.0000 mg | INTRAMUSCULAR | Status: DC | PRN

## 2013-11-16 MED ORDER — CEFAZOLIN SODIUM 1-5 GM-% IV SOLN
1.0000 g | Freq: Three times a day (TID) | INTRAVENOUS | Status: DC
  Administered 2013-11-17: 1 g via INTRAVENOUS
  Filled 2013-11-16 (×3): qty 50

## 2013-11-16 MED ORDER — METHOCARBAMOL 500 MG PO TABS
500.0000 mg | ORAL_TABLET | Freq: Four times a day (QID) | ORAL | Status: DC | PRN
  Administered 2013-11-16: 500 mg via ORAL
  Filled 2013-11-16: qty 1

## 2013-11-16 MED ORDER — HYDROMORPHONE HCL PF 1 MG/ML IJ SOLN: 0.2500 mg | INTRAMUSCULAR | Status: DC | PRN

## 2013-11-16 MED ORDER — LATANOPROST 0.005 % OP SOLN
1.0000 [drp] | Freq: Every day | OPHTHALMIC | Status: DC
  Filled 2013-11-16: qty 2.5

## 2013-11-16 MED ORDER — LIDOCAINE HCL (CARDIAC) 20 MG/ML IV SOLN
INTRAVENOUS | Status: AC
  Filled 2013-11-16: qty 5

## 2013-11-16 MED ORDER — LACTATED RINGERS IV SOLN
INTRAVENOUS | Status: DC
  Administered 2013-11-16 (×3): via INTRAVENOUS

## 2013-11-16 MED ORDER — ADULT MULTIVITAMIN W/MINERALS CH
1.0000 | ORAL_TABLET | Freq: Every day | ORAL | Status: DC
Start: 2013-11-17 — End: 2013-11-17
  Administered 2013-11-17: 1 via ORAL
  Filled 2013-11-16 (×2): qty 1

## 2013-11-16 MED ORDER — PROPOFOL 10 MG/ML IV BOLUS
INTRAVENOUS | Status: DC | PRN
  Administered 2013-11-16: 150 mg via INTRAVENOUS

## 2013-11-16 MED ORDER — OXYCODONE HCL 5 MG PO TABS
5.0000 mg | ORAL_TABLET | ORAL | Status: DC | PRN
  Administered 2013-11-16 – 2013-11-17 (×2): 5 mg via ORAL
  Filled 2013-11-16 (×2): qty 1

## 2013-11-16 MED ORDER — FENTANYL CITRATE 0.05 MG/ML IJ SOLN
50.0000 ug | Freq: Once | INTRAMUSCULAR | Status: AC
  Administered 2013-11-16: 50 ug via INTRAVENOUS

## 2013-11-16 MED ORDER — OXYCODONE HCL 5 MG/5ML PO SOLN: 5.0000 mg | Freq: Once | ORAL | Status: DC | PRN

## 2013-11-16 MED ORDER — DIPHENHYDRAMINE HCL 25 MG PO CAPS: 25.0000 mg | ORAL_CAPSULE | Freq: Four times a day (QID) | ORAL | Status: DC | PRN

## 2013-11-16 MED ORDER — VITAMIN C 500 MG PO TABS
1000.0000 mg | ORAL_TABLET | Freq: Every day | ORAL | Status: DC
  Administered 2013-11-17: 1000 mg via ORAL
  Filled 2013-11-16 (×2): qty 2

## 2013-11-16 MED ORDER — ONDANSETRON HCL 4 MG PO TABS: 4.0000 mg | ORAL_TABLET | Freq: Four times a day (QID) | ORAL | Status: DC | PRN

## 2013-11-16 MED ORDER — MEPERIDINE HCL 25 MG/ML IJ SOLN: 6.2500 mg | INTRAMUSCULAR | Status: DC | PRN

## 2013-11-16 MED ORDER — ONDANSETRON HCL 4 MG/2ML IJ SOLN: 4.0000 mg | Freq: Four times a day (QID) | INTRAMUSCULAR | Status: DC | PRN

## 2013-11-16 MED ORDER — OXYCODONE HCL 5 MG PO TABS: 5.0000 mg | ORAL_TABLET | Freq: Once | ORAL | Status: DC | PRN

## 2013-11-16 MED ORDER — PROPOFOL 10 MG/ML IV BOLUS
INTRAVENOUS | Status: AC
  Filled 2013-11-16: qty 20

## 2013-11-16 MED ORDER — BUPIVACAINE-EPINEPHRINE PF 0.5-1:200000 % IJ SOLN
INTRAMUSCULAR | Status: DC | PRN
  Administered 2013-11-16: 20 mL via PERINEURAL

## 2013-11-16 MED ORDER — EPHEDRINE SULFATE 50 MG/ML IJ SOLN
INTRAMUSCULAR | Status: DC | PRN
  Administered 2013-11-16 (×4): 10 mg via INTRAVENOUS

## 2013-11-16 MED ORDER — CEFAZOLIN SODIUM 1-5 GM-% IV SOLN
1.0000 g | INTRAVENOUS | Status: AC
  Administered 2013-11-16: 1 g via INTRAVENOUS
  Filled 2013-11-16: qty 50

## 2013-11-16 MED ORDER — MIDAZOLAM HCL 2 MG/2ML IJ SOLN
0.5000 mg | Freq: Once | INTRAMUSCULAR | Status: AC
  Administered 2013-11-16: 0.5 mg via INTRAVENOUS

## 2013-11-16 MED ORDER — LIDOCAINE-EPINEPHRINE (PF) 1.5 %-1:200000 IJ SOLN
INTRAMUSCULAR | Status: DC | PRN
  Administered 2013-11-16: 20 mL via PERINEURAL

## 2013-11-16 MED ORDER — MIDAZOLAM HCL 2 MG/2ML IJ SOLN
INTRAMUSCULAR | Status: AC
  Administered 2013-11-16: 0.5 mg via INTRAVENOUS
  Filled 2013-11-16: qty 2

## 2013-11-16 MED ORDER — OMEGA-3-ACID ETHYL ESTERS 1 G PO CAPS
1.0000 g | ORAL_CAPSULE | Freq: Every day | ORAL | Status: DC
  Administered 2013-11-17: 1 g via ORAL
  Filled 2013-11-16: qty 1

## 2013-11-16 MED ORDER — SENNA 8.6 MG PO TABS
1.0000 | ORAL_TABLET | Freq: Two times a day (BID) | ORAL | Status: DC
  Administered 2013-11-17: 8.6 mg via ORAL
  Filled 2013-11-16 (×3): qty 1

## 2013-11-16 MED ORDER — FENTANYL CITRATE 0.05 MG/ML IJ SOLN
INTRAMUSCULAR | Status: AC
  Administered 2013-11-16: 50 ug via INTRAVENOUS
  Filled 2013-11-16: qty 2

## 2013-11-16 MED ORDER — LACTATED RINGERS IV SOLN: INTRAVENOUS | Status: DC

## 2013-11-16 MED ORDER — EPHEDRINE SULFATE 50 MG/ML IJ SOLN
INTRAMUSCULAR | Status: AC
  Filled 2013-11-16: qty 1

## 2013-11-16 SURGICAL SUPPLY — 59 items
BANDAGE ELASTIC 3 VELCRO ST LF (GAUZE/BANDAGES/DRESSINGS) ×2 IMPLANT
BANDAGE ELASTIC 4 VELCRO ST LF (GAUZE/BANDAGES/DRESSINGS) ×2 IMPLANT
BANDAGE GAUZE ELAST BULKY 4 IN (GAUZE/BANDAGES/DRESSINGS) ×2 IMPLANT
BIT DRILL 2.2 SS TIBIAL (BIT) ×2 IMPLANT
BLADE SURG ROTATE 9660 (MISCELLANEOUS) IMPLANT
BNDG ESMARK 4X9 LF (GAUZE/BANDAGES/DRESSINGS) ×2 IMPLANT
CORDS BIPOLAR (ELECTRODE) ×2 IMPLANT
COVER SURGICAL LIGHT HANDLE (MISCELLANEOUS) ×2 IMPLANT
CUFF TOURNIQUET SINGLE 18IN (TOURNIQUET CUFF) ×2 IMPLANT
CUFF TOURNIQUET SINGLE 24IN (TOURNIQUET CUFF) IMPLANT
DECANTER SPIKE VIAL GLASS SM (MISCELLANEOUS) IMPLANT
DRAIN TLS ROUND 10FR (DRAIN) IMPLANT
DRAPE OEC MINIVIEW 54X84 (DRAPES) IMPLANT
DRAPE U-SHAPE 47X51 STRL (DRAPES) ×2 IMPLANT
DRSG EMULSION OIL 3X3 NADH (GAUZE/BANDAGES/DRESSINGS) ×2 IMPLANT
GAUZE XEROFORM 1X8 LF (GAUZE/BANDAGES/DRESSINGS) ×2 IMPLANT
GLOVE ORTHO TXT STRL SZ7.5 (GLOVE) ×2 IMPLANT
GLOVE SS BIOGEL STRL SZ 8 (GLOVE) ×1 IMPLANT
GLOVE SUPERSENSE BIOGEL SZ 8 (GLOVE) ×1
GOWN STRL REUS W/ TWL LRG LVL3 (GOWN DISPOSABLE) ×3 IMPLANT
GOWN STRL REUS W/ TWL XL LVL3 (GOWN DISPOSABLE) ×3 IMPLANT
GOWN STRL REUS W/TWL LRG LVL3 (GOWN DISPOSABLE) ×3
GOWN STRL REUS W/TWL XL LVL3 (GOWN DISPOSABLE) ×3
KIT BASIN OR (CUSTOM PROCEDURE TRAY) ×2 IMPLANT
KIT ROOM TURNOVER OR (KITS) ×2 IMPLANT
LOOP VESSEL MAXI BLUE (MISCELLANEOUS) IMPLANT
MANIFOLD NEPTUNE II (INSTRUMENTS) ×2 IMPLANT
NEEDLE 22X1 1/2 (OR ONLY) (NEEDLE) IMPLANT
NS IRRIG 1000ML POUR BTL (IV SOLUTION) ×2 IMPLANT
PACK ORTHO EXTREMITY (CUSTOM PROCEDURE TRAY) ×2 IMPLANT
PAD ARMBOARD 7.5X6 YLW CONV (MISCELLANEOUS) ×4 IMPLANT
PAD CAST 4YDX4 CTTN HI CHSV (CAST SUPPLIES) ×1 IMPLANT
PADDING CAST ABS 4INX4YD NS (CAST SUPPLIES) ×1
PADDING CAST ABS COTTON 4X4 ST (CAST SUPPLIES) ×1 IMPLANT
PADDING CAST COTTON 4X4 STRL (CAST SUPPLIES) ×1
PEG LOCKING SMOOTH 2.2X14 (Peg) ×2 IMPLANT
PEG LOCKING SMOOTH 2.2X16 (Screw) ×2 IMPLANT
PEG LOCKING SMOOTH 2.2X18 (Peg) ×4 IMPLANT
PLATE NARROW DVR RIGHT (Plate) ×2 IMPLANT
SCREW 2.7X12MM (Screw) ×2 IMPLANT
SCREW LOCK 14X2.7X 3 LD TPR (Screw) ×1 IMPLANT
SCREW LOCKING 2.7X13MM (Screw) ×2 IMPLANT
SCREW LOCKING 2.7X14 (Screw) ×1 IMPLANT
SCREW LOCKING 2.7X15MM (Screw) ×2 IMPLANT
SPLINT FIBERGLASS 3X12 (CAST SUPPLIES) ×2 IMPLANT
SPONGE GAUZE 4X4 12PLY (GAUZE/BANDAGES/DRESSINGS) ×2 IMPLANT
SPONGE GAUZE 4X4 12PLY STER LF (GAUZE/BANDAGES/DRESSINGS) ×2 IMPLANT
SPONGE LAP 4X18 X RAY DECT (DISPOSABLE) IMPLANT
SUT MNCRL AB 4-0 PS2 18 (SUTURE) ×2 IMPLANT
SUT PROLENE 3 0 PS 2 (SUTURE) IMPLANT
SUT VIC AB 3-0 FS2 27 (SUTURE) IMPLANT
SYR CONTROL 10ML LL (SYRINGE) IMPLANT
SYSTEM CHEST DRAIN TLS 7FR (DRAIN) ×2 IMPLANT
TOWEL OR 17X24 6PK STRL BLUE (TOWEL DISPOSABLE) ×2 IMPLANT
TOWEL OR 17X26 10 PK STRL BLUE (TOWEL DISPOSABLE) ×2 IMPLANT
TUBE CONNECTING 12X1/4 (SUCTIONS) ×2 IMPLANT
TUBE EVACUATION TLS (MISCELLANEOUS) ×2 IMPLANT
UNDERPAD 30X30 INCONTINENT (UNDERPADS AND DIAPERS) ×2 IMPLANT
WATER STERILE IRR 1000ML POUR (IV SOLUTION) ×2 IMPLANT

## 2013-11-16 NOTE — Evaluation (Signed)
Occupational Therapy Evaluation Patient Details Name: Kelly Grimes MRN: [id number] DOB: [birth year] Today's Date: 11/16/2013    History of Present Illness 73 y.o. s/p OPEN REDUCTION INTERNAL FIXATION (ORIF) RIGHT DISTAL RADIUS FRACTURE WITH REPAIR AS NEEDED AND ALLOGRAFT BONE GRAFT (Right)   Clinical Impression   Pt moving well during session. Education provided and feel pt is safe to d/c home, from OT standpoint.    Follow Up Recommendations  No OT follow up;Supervision - Intermittent    Equipment Recommendations  None recommended by OT    Recommendations for Other Services       Precautions / Restrictions Precautions Precaution Comments: no pushing, pulling, lifting Restrictions Weight Bearing Restrictions: Yes RUE Weight Bearing: Non weight bearing Other Position/Activity Restrictions: only weightbear through elbow      Mobility Bed Mobility Overal bed mobility: Modified Independent                Transfers Overall transfer level: Needs assistance Equipment used: None Transfers: Sit to/from Stand Sit to Stand: Modified independent (Device/Increase time);Supervision         General transfer comment: supervision-cues to be sure maintained precautions for toilet transfer.    Balance                                            ADL Overall ADL's : Needs assistance/impaired     Grooming: Wash/dry hands;Standing;Set up;Modified independent (washed hands-Mod I; overall may need Setup A to open items)               Lower Body Dressing: Supervision/safety;Sit to/from stand   Toilet Transfer: Modified Independent;Ambulation;Supervision/safety (supervision- to be sure maintained precautions with transfer)   Toileting- Clothing Manipulation and Hygiene: Modified independent (clothing-standing)   Tub/ Shower Transfer: Modified independent;Ambulation   Functional mobility during ADLs: Modified independent General ADL Comments:  Educated on precautions as well as retrograde massage and moving digits. Instructed to ice and elevate RUE. Educated on precautions. Recommended spouse be with pt for shower transfer and while bathing.      Vision                     Perception     Praxis      Pertinent Vitals/Pain No pain reported. RUE elevated and ice applied at end of session.     Hand Dominance Right   Extremity/Trunk Assessment Upper Extremity Assessment Upper Extremity Assessment: RUE deficits/detail RUE: Unable to fully assess due to immobilization RUE Sensation: decreased light touch   Lower Extremity Assessment Lower Extremity Assessment: Overall WFL for tasks assessed       Communication Communication Communication: No difficulties   Cognition Arousal/Alertness: Awake/alert Behavior During Therapy: WFL for tasks assessed/performed Overall Cognitive Status: Within Functional Limits for tasks assessed                     General Comments       Exercises Exercises: Other exercises Other Exercises Other Exercises: AAROM digit composite flexion/extension   Shoulder Instructions      Home Living Family/patient expects to be discharged to:: Private residence Living Arrangements: Spouse/significant other Available Help at Discharge: Family Type of Home: House Home Access: Stairs to enter Technical brewer of Steps: 1 Entrance Stairs-Rails: None Home Layout: Two level;Able to live on main level with bedroom/bathroom     Bathroom Shower/Tub: Walk-in shower  Bathroom Toilet: Standard                Prior Functioning/Environment Level of Independence: Independent             OT Diagnosis:     OT Problem List:     OT Treatment/Interventions:      OT Goals(Current goals can be found in the care plan section)    OT Frequency:     Barriers to D/C:            Co-evaluation              End of Session    Activity Tolerance: Patient  tolerated treatment well Patient left: in bed;with call bell/phone within reach;with family/visitor present   Time: 2440-1027 OT Time Calculation (min): 15 min Charges:  OT General Charges $OT Visit: 1 Procedure OT Evaluation $Initial OT Evaluation Tier I: 1 Procedure G-Codes: OT G-codes **NOT FOR INPATIENT CLASS** Functional Assessment Tool Used: clinical judgment Functional Limitation: Self care Self Care Current Status (O5366): At least 1 percent but less than 20 percent impaired, limited or restricted Self Care Goal Status (Y4034): At least 1 percent but less than 20 percent impaired, limited or restricted Self Care Discharge Status [id number]): At least 1 percent but less than 20 percent impaired, limited or restricted  Benito Mccreedy OTR/L [phone] 11/16/2013, 6:03 PM

## 2013-11-16 NOTE — Op Note (Signed)
Kelly Grimes, Kelly Grimes NO.:  [id number]  MEDICAL RECORD NO.:  [id number]  LOCATION:  [id number]                        FACILITY:  [facility]  PHYSICIAN:  Satira Anis. Arshi Duarte, M.D.DATE OF BIRTH:  Jul 04, 1941  DATE OF PROCEDURE: DATE OF DISCHARGE:                              OPERATIVE REPORT   PREOPERATIVE DIAGNOSIS:  Comminuted complex greater than 3 part intra- articular distal radius fracture, right upper extremity.  POSTOPERATIVE DIAGNOSIS:  Comminuted complex greater than 3 part intra- articular distal radius fracture, right upper extremity.  PROCEDURE: 1. Open reduction and internal fixation, distal radius fracture,     greater than 3 part in nature comminuted intra-articular, right     upper extremity with DVR plate. 2. AP, lateral, and oblique views/radiographs interpreted by myself (4-     view radiograph series). 3. Brachioradialis sliding tenotomy.  SURGEON:  Satira Anis. Amedeo Plenty, M.D.  ASSISTANT:  Avelina Laine, P.A.-C.  COMPLICATION:  None.  ANESTHESIA:  General with preoperative block.  TOURNIQUET TIME:  Less than an hour.  DRAINS:  One.  INDICATIONS:  This pleasant female presents with the above-mentioned diagnosis.  I have counseled her regarding the risks and benefits of surgery including risk of infection, bleeding, anesthesia, damage to normal structures, and failure of surgery to accomplish its intended goals of relieving symptoms and restoring function.  With this in mind, she desires to proceed.  All questions have been encouraged and answered preoperatively.  OPERATIVE PROCEDURE:  The patient was seen by myself and Anesthesia, taken to operative suite, and a smooth induction of general anesthetic. Preoperatively, she had been counseled and block placed.  In the operative arena, we performed a very careful evaluation of the extremity, she was prepped and draped in usual sterile fashion.  Preop antibiotics were given.  Time-out was called.   Following this, a curvilinear volar radial incision was made.  Dissection was carried down.  The FCR tendon sheath was incised palmarly and dorsally.  Carpal canal contents were retracted ulnarly.  Pronator was then incised in L- shape fashion and retracted off the bony fracture.  Sliding brachioradialis tenotomy was accomplished without difficulty.  Following this, we then reduced the fracture and applied a narrow DVR plate and screw construct.  This was performed without difficulty and I was able to achieve adequate radial height inclination and volar tilt. Following placement of the plate, she had stability confirmed in her distal radial ulnar joint.  There were no complicating features, and the radiocarpal and mid carpal rows looked excellent.  AP, lateral, and multiple obliques were taken with radiograph series.  This was interpreted by myself.  Thus, 4-view x-rays were performed and there were no problems with screw lengths or other points of fixation.  The patient tolerated this well.  There were no complicating features.  The patient had final copy x-rays taken for documentation.  Wound was closed with 3-0 Vicryl over the pronator and to close the pronator over the plate followed by irrigation.  Followed by closure of the skin edge with Prolene.  She has very thin skin, thus we want to be very careful and make sure that things go absolutely smoothly for her.  We are going to  monitor her in the recovery room.  Admit her for antibiotics and general postoperative observatory measures.  We will proceed according to a standard DVR protocol in our office once she returns.     Satira Anis. Amedeo Plenty, M.D.     [facility]  D:  11/16/2013  T:  11/16/2013  Job:  [id number]

## 2013-11-16 NOTE — Anesthesia Procedure Notes (Addendum)
Anesthesia Regional Block:  Axillary brachial plexus block  Pre-Anesthetic Checklist: ,, timeout performed, Correct Patient, Correct Site, Correct Laterality, Correct Procedure, Correct Position, site marked, Risks and benefits discussed,  Surgical consent,  Pre-op evaluation,  At surgeon's request and post-op pain management  Laterality: Right and Upper  Prep: chloraprep       Needles:  Injection technique: Single-shot  Needle Type: Other   (#22ga short "B" bevel needle)    Needle Gauge: 22 and 22 G    Additional Needles:  Procedures: paresthesia technique Axillary brachial plexus block  Nerve Stimulator or Paresthesia:  Response: transient median nerve paresthesia,  Response: transient ulnar nerve paresthesia,   Additional Responses:   Narrative:  Start time: 11/16/2013 11:57 AM End time: 11/16/2013 12:03 PM Injection made incrementally with aspirations every 5 mL.  Performed by: Personally  Anesthesiologist: Jenita Seashore, MD  Additional Notes: Pt identified in Holding room.  Monitors applied. Working IV access confirmed. Sterile prep R axilla.  #22ga short "B" bevel needle to transient median paresthesia, then transient ulnar nerve paresthesia.  20cc 0.5% Bupivacaine with 1:200k epi and 20cc 1.5% lidocaine with 1:200k epi injected incrementally, distributed around each paresthesia, after negative test doses. Addition 5cc 1.5% lidocaine with 1:200k epi injected subg for intercostobrachial nerve supplementation.  Patient asymptomatic, VSS, no heme aspirated, tolerated well.  Jenita Seashore, MD   Procedure Name: LMA Insertion Date/Time: 11/16/2013 1:16 PM Performed by: Marinda Elk A Pre-anesthesia Checklist: Patient identified, Timeout performed, Emergency Drugs available, Suction available and Patient being monitored Patient Re-evaluated:Patient Re-evaluated prior to inductionOxygen Delivery Method: Circle system utilized Preoxygenation: Pre-oxygenation with 100%  oxygen Intubation Type: IV induction Ventilation: Mask ventilation without difficulty LMA: LMA inserted LMA Size: 4.0 Number of attempts: 1 Placement Confirmation: positive ETCO2 and breath sounds checked- equal and bilateral Tube secured with: Tape Dental Injury: Teeth and Oropharynx as per pre-operative assessment

## 2013-11-16 NOTE — H&P (Signed)
Kelly KINNAMON is an 73 y.o. female.   Chief Complaint: My wrist is broken HPI: The patient is a very pleasant 73 year old female unfortunately sustained a right distal radius fracture while trying to promote her yard. States she was pushing the mower out of the garden she tripped and fell onto an outstretched hand. She had immediate pain and swelling and difficulty using the upper extremity. She was seen and evaluated in the emergency room setting where she was noted to have a interarticular displaced distal radius fracture about the right upper extremity she was referred to the office for further evaluation and surgical consideration. I should note she is a very active 73 year old female she is right hand dominant she lives independent we discussed with her proceeding open reduction internal fixation given her fracture pattern. The patient does desire to proceed as she is very active and this is her dominant hand.  Past Medical History  Diagnosis Date  . Osteopenia   . Glaucoma     Pre glaucoma Dr. Ellie Lunch  . Hyperlipidemia   . Melanoma 1993    Left arm Dr Allyson Sabal  . Mild aortic regurgitation 12/11    Mild mitral regurgitation by ECHO   . Headache     Dr. Krista Blue    Past Surgical History  Procedure Laterality Date  . Joint replacement      hip  . Hernia repair      right inguinal/femoral  . Eye surgery      od cataract    Family History  Problem Relation Age of Onset  . Alzheimer's disease Mother   . Pancreatic cancer Father    Social History:  reports that she has never smoked. She does not have any smokeless tobacco history on file. She reports that she does not drink alcohol or use illicit drugs.  Allergies: No Known Allergies  No prescriptions prior to admission    Results for orders placed during the hospital encounter of 11/15/13 (from the past 48 hour(s))  CBC     Status: None   Collection Time    11/15/13  8:31 AM      Result Value Ref Range   WBC 5.2  4.0 - 10.5  K/uL   RBC 4.06  3.87 - 5.11 MIL/uL   Hemoglobin 12.8  12.0 - 15.0 g/dL   HCT 38.8  36.0 - 46.0 %   MCV 95.6  78.0 - 100.0 fL   MCH 31.5  26.0 - 34.0 pg   MCHC 33.0  30.0 - 36.0 g/dL   RDW 14.3  11.5 - 15.5 %   Platelets 197  150 - 400 K/uL  BASIC METABOLIC PANEL     Status: Abnormal   Collection Time    11/15/13  8:31 AM      Result Value Ref Range   Sodium 145  137 - 147 mEq/L   Potassium 4.6  3.7 - 5.3 mEq/L   Chloride 107  96 - 112 mEq/L   CO2 24  19 - 32 mEq/L   Glucose, Bld 50 (*) 70 - 99 mg/dL   BUN 22  6 - 23 mg/dL   Creatinine, Ser 0.57  0.50 - 1.10 mg/dL   Calcium 9.3  8.4 - 10.5 mg/dL   GFR calc non Af Amer >90  >90 mL/min   GFR calc Af Amer >90  >90 mL/min   Comment: (NOTE)     The eGFR has been calculated using the CKD EPI equation.  This calculation has not been validated in all clinical situations.     eGFR's persistently <90 mL/min signify possible Chronic Kidney     Disease.   No results found.  Review of Systems  Constitutional: Negative.   HENT: Negative.   Eyes: Negative.   Respiratory: Negative.   Cardiovascular: Negative.   Gastrointestinal: Negative.   Genitourinary: Negative.   Musculoskeletal:       See history of present illness  Skin: Negative.   Endo/Heme/Allergies: Negative.     There were no vitals taken for this visit. Physical Exam  ..The patient is alert and oriented in no acute distress the patient complains of pain in the affected upper extremity. Physical examination of the right upper extremity shows that she has swelling about the dorsal aspect of the hand and digits with ecchymosis present, radial ulnar and median nerve distributions are intact. Digital range of motion is intact splint is intact her skin is intact about the distal and proximal portions that are exposed  The patient is noted to have a normal HEENT exam.  Lung fields show equal chest expansion and no shortness of breath  abdomen exam is nontender without  distention.  Lower extremity examination does not show any fracture dislocation or blood clot symptoms.  Pelvis is stable neck and back are stable and nontender   Assessment/Plan Comminuted complex intra-articular distal radius fracture about the right wrist and a very pleasant active 73 year old finger .We are planning surgery for your upper extremity. The risk and benefits of surgery include risk of bleeding infection anesthesia damage to normal structures and failure of the surgery to accomplish its intended goals of relieving symptoms and restoring function with this in mind we'll going to proceed. I have specifically discussed with the patient the pre-and postoperative regime and the does and don'ts and risk and benefits in great detail. Risk and benefits of surgery also include risk of dystrophy chronic nerve pain failure of the healing process to go onto completion and other inherent risks of surgery The relavent the pathophysiology of the disease/injury process, as well as the alternatives for treatment and postoperative course of action has been discussed in great detail with the patient who desires to proceed.  We will do everything in our power to help you (the patient) restore function to the upper extremity. Is a pleasure to see this patient today. Patient Active Problem List   Diagnosis Date Noted  . DOE (dyspnea on exertion) 07/17/2013  . Mild aortic regurgitation   . Aortic valve disorders 07/12/2013  . Observation for suspected cardiovascular disease 07/12/2013     Ivan Croft 11/16/2013, 8:04 AM

## 2013-11-16 NOTE — Transfer of Care (Signed)
Immediate Anesthesia Transfer of Care Note  Patient: Kelly Grimes  Procedure(s) Performed: Procedure(s): OPEN REDUCTION INTERNAL FIXATION (ORIF) RIGHT DISTAL RADIUS FRACTURE WITH REPAIR AS NEEDED AND ALLOGRAFT BONE GRAFT (Right)  Patient Location: PACU  Anesthesia Type:General  Level of Consciousness: awake  Airway & Oxygen Therapy: Patient Spontanous Breathing  Post-op Assessment: Report given to PACU RN and Post -op Vital signs reviewed and stable  Post vital signs: Reviewed and stable  Complications: No apparent anesthesia complications

## 2013-11-16 NOTE — Op Note (Signed)
See Dictation #[id number] Hardie Pulley MD

## 2013-11-16 NOTE — Anesthesia Preprocedure Evaluation (Addendum)
Anesthesia Evaluation  Patient identified by MRN, date of birth, ID band Patient awake    Reviewed: Allergy & Precautions, H&P , NPO status , Patient's Chart, lab work & pertinent test results  History of Anesthesia Complications Negative for: history of anesthetic complications  Airway Mallampati: II TM Distance: >3 FB Neck ROM: Full    Dental  (+) Teeth Intact, Dental Advisory Given   Pulmonary neg pulmonary ROS,  breath sounds clear to auscultation  Pulmonary exam normal       Cardiovascular negative cardio ROS  + Valvular Problems/Murmurs Rhythm:Regular Rate:Normal + Diastolic murmurs 48/54 stress test: normal perfusion, no ischemia, EF 70% 12/14 ECHO: normal LVF, EF 60-65%, mod AI   Neuro/Psych negative neurological ROS     GI/Hepatic negative GI ROS, Neg liver ROS,   Endo/Other  negative endocrine ROS  Renal/GU negative Renal ROS     Musculoskeletal   Abdominal   Peds  Hematology negative hematology ROS (+)   Anesthesia Other Findings   Reproductive/Obstetrics                          Anesthesia Physical Anesthesia Plan  ASA: II  Anesthesia Plan: General   Post-op Pain Management:    Induction: Intravenous  Airway Management Planned: LMA  Additional Equipment:   Intra-op Plan:   Post-operative Plan:   Informed Consent: I have reviewed the patients History and Physical, chart, labs and discussed the procedure including the risks, benefits and alternatives for the proposed anesthesia with the patient or authorized representative who has indicated his/her understanding and acceptance.   Dental advisory given  Plan Discussed with: CRNA and Surgeon  Anesthesia Plan Comments: (Plan routine monitors, GA- LMA OK, axillary block for post op analgesia)        Anesthesia Quick Evaluation

## 2013-11-16 NOTE — Anesthesia Postprocedure Evaluation (Signed)
  Anesthesia Post-op Note  Patient: Kelly Grimes  Procedure(s) Performed: Procedure(s): OPEN REDUCTION INTERNAL FIXATION (ORIF) RIGHT DISTAL RADIUS FRACTURE WITH REPAIR AS NEEDED AND ALLOGRAFT BONE GRAFT (Right)  Patient Location: PACU  Anesthesia Type:GA combined with regional for post-op pain  Level of Consciousness: awake, alert , oriented and patient cooperative  Airway and Oxygen Therapy: Patient Spontanous Breathing  Post-op Pain: none  Post-op Assessment: Post-op Vital signs reviewed, Patient's Cardiovascular Status Stable, Respiratory Function Stable, Patent Airway, No signs of Nausea or vomiting and Pain level controlled  Post-op Vital Signs: Reviewed and stable  Last Vitals:  Filed Vitals:   11/16/13 1524  BP:   Pulse: 86  Temp: 36.4 C  Resp: 18    Complications: No apparent anesthesia complications

## 2013-11-16 NOTE — Progress Notes (Signed)
I continue to monitor patient. Comfort measures offered. Patient with call light within reach.

## 2013-11-17 NOTE — Progress Notes (Signed)
Discharge instructions reviewed with patient. Verbalizes understanding. Handout given. Patient's husband will be her ride. Ready for discharge.

## 2013-11-17 NOTE — Progress Notes (Signed)
PT Cancellation Note  Patient Details Name: Kelly Grimes MRN: [id number] DOB: Mar 31, 1941   Cancelled Treatment:    Reason Eval/Treat Not Completed: PT screened, no needs identified, will sign off   [facility] 11/17/2013, 7:44 AM  Roney Marion, [place] Pager [phone] Office [phone]

## 2013-11-17 NOTE — Progress Notes (Signed)
UR Completed.  Kelly Grimes [id number] 11/17/2013

## 2013-11-17 NOTE — Discharge Summary (Signed)
Physician Discharge Summary  Patient ID: Kelly Grimes MRN: [id number] DOB/AGE: 11-22-1940 73 y.o.  Admit date: 11/16/2013 Discharge date: 11/17/2013  Admission Diagnoses: Comminuted complex distal radius fracture about the right upper extremity  Discharge Diagnoses: Same, status post open reduction internal fixation Active Problems:   Distal radius fracture   Discharged Condition: Improved  Hospital Course: The patient is a pleasant 73 year old female who presented with a comminuted complex in articular distal radius fracture. We discussed with her given her fracture pattern we would recommend open reduction internal fixation. She is a very active 73 year old female who lives independently and is right-hand dominant. We discussed with her at length our recommendations and she is in agreement to have the right wrist surgery. We proceeded to the operative suite on April 10 where she underwent open reduction and internal fixation of her right distal radius fracture. There was no intraoperative complications she tolerated the procedure well. Please see operative procedure for full details. Patient was admitted to the orthopedic unit for IV antibiotics, pain management and observation. I should note she did extremely well overall and had no postoperative difficulties. On postoperative day #1 she was awake alert and very pleasant. Her pain was controlled with her current pain regime and by mouth medications he denied any nausea or vomiting. She denied fever chills. He was tolerating a regular diet and voiding without difficulties patient was eager for discharge to home. We discussed all discharge issues at length with she and her husband who was visiting at the time. The patient was previously given her medications in the office setting to include postoperative pain medications  Consults: None  Significant Diagnostic Studies: See radiographs  Treatments: Open reduction internal fixation right  distal radius fracture, please see operative report for full details  Discharge Exam: Blood pressure 126/61, pulse 86, temperature 98 F (36.7 C), temperature source Oral, resp. rate 18, SpO2 97.00%. The patient is awake, alert and oriented. She is very conversant and pleasant. Evaluation of the right upper shows that her splint is clean and intact. The drain is removed without difficulties. She has mild swelling and ecchymosis of the digits and dorsal hand. This is improved after range of motion measures and massage. Neurovascularly she is intact. Her sensation refill are intact. Marland Kitchen.The patient is alert and oriented in no acute distress the patient complains of pain in the affected upper extremity.  The patient is noted to have a normal HEENT exam.  Lung fields show equal chest expansion and no shortness of breath  abdomen exam is nontender without distention.  Lower extremity examination does not show any fracture dislocation or blood clot symptoms.  Pelvis is stable neck and back are stable and nontender  Disposition: 01-Home or Self Care  Discharge Orders   Future Orders Complete By Expires   Call MD / Call 911  As directed    Constipation Prevention  As directed    Diet - low sodium heart healthy  As directed    Discharge instructions  As directed    Increase activity slowly as tolerated  As directed        Medication List    TAKE these medications       Bilberry 60 MG Caps  Take 1 capsule by mouth daily.     calcium-vitamin D 500-200 MG-UNIT per tablet  Commonly known as:  OSCAL WITH D  Take 1 tablet by mouth 4 (four) times daily - after meals and at bedtime.     omega-3  acid ethyl esters 1 G capsule  Commonly known as:  LOVAZA  Take 1 g by mouth daily.     Travoprost (BAK Free) 0.004 % Soln ophthalmic solution  Commonly known as:  TRAVATAN  Place 1 drop into both eyes at bedtime.      ASK your doctor about these medications       vitamin C 500 MG tablet  Commonly  known as:  ASCORBIC ACID  Take 1,000 mg by mouth daily.           Follow-up Information   Follow up with [facility] Jon Gills, MD. (keep scheduled appt to see [place] in 2 weeks, call for any questions or concerns)    Specialty:  Orthopedic Surgery   Contact information:   [address] [place] [address] [place] [address] [phone]       Signed: Ivan Grimes 11/17/2013, 10:01 AM

## 2013-11-17 NOTE — Discharge Instructions (Signed)
Cast or Splint Care  Casts and splints support injured limbs and keep bones from moving while they heal. It is important to care for your cast or splint at home.   HOME CARE INSTRUCTIONS   Keep the cast or splint uncovered during the drying period. It can take 24 to 48 hours to dry if it is made of plaster. A fiberglass cast will dry in less than 1 hour.   Do not rest the cast on anything harder than a pillow for the first 24 hours.   Do not put weight on your injured limb or apply pressure to the cast until your health care provider gives you permission.   Keep the cast or splint dry. Wet casts or splints can lose their shape and may not support the limb as well. A wet cast that has lost its shape can also create harmful pressure on your skin when it dries. Also, wet skin can become infected.   Cover the cast or splint with a plastic bag when bathing or when out in the rain or snow. If the cast is on the trunk of the body, take sponge baths until the cast is removed.   If your cast does become wet, dry it with a towel or a blow dryer on the cool setting only.   Keep your cast or splint clean. Soiled casts may be wiped with a moistened cloth.   Do not place any hard or soft foreign objects under your cast or splint, such as cotton, toilet paper, lotion, or powder.   Do not try to scratch the skin under the cast with any object. The object could get stuck inside the cast. Also, scratching could lead to an infection. If itching is a problem, use a blow dryer on a cool setting to relieve discomfort.   Do not trim or cut your cast or remove padding from inside of it.   Exercise all joints next to the injury that are not immobilized by the cast or splint. For example, if you have a long leg cast, exercise the hip joint and toes. If you have an arm cast or splint, exercise the shoulder, elbow, thumb, and fingers.   Elevate your injured arm or leg on 1 or 2 pillows for the first 1 to 3 days to decrease  swelling and pain.It is best if you can comfortably elevate your cast so it is higher than your heart.  SEEK MEDICAL CARE IF:    Your cast or splint cracks.   Your cast or splint is too tight or too loose.   You have unbearable itching inside the cast.   Your cast becomes wet or develops a soft spot or area.   You have a bad smell coming from inside your cast.   You get an object stuck under your cast.   Your skin around the cast becomes red or raw.   You have new pain or worsening pain after the cast has been applied.  SEEK IMMEDIATE MEDICAL CARE IF:    You have fluid leaking through the cast.   You are unable to move your fingers or toes.   You have discolored (blue or white), cool, painful, or very swollen fingers or toes beyond the cast.   You have tingling or numbness around the injured area.   You have severe pain or pressure under the cast.   You have any difficulty with your breathing or have shortness of breath.   You have chest 

## 2013-11-18 ENCOUNTER — Encounter (HOSPITAL_COMMUNITY): Payer: Self-pay | Admitting: Orthopedic Surgery

## 2014-03-11 ENCOUNTER — Other Ambulatory Visit (HOSPITAL_COMMUNITY): Payer: Self-pay | Admitting: Obstetrics and Gynecology

## 2014-03-11 DIAGNOSIS — Z1231 Encounter for screening mammogram for malignant neoplasm of breast: Secondary | ICD-10-CM

## 2014-04-18 ENCOUNTER — Ambulatory Visit (HOSPITAL_COMMUNITY)
Admission: RE | Admit: 2014-04-18 | Discharge: 2014-04-18 | Disposition: A | Discharge status: Home / Self Care | Payer: Medicare Other | Source: Ambulatory Visit | Attending: Obstetrics and Gynecology | Admitting: Obstetrics and Gynecology

## 2014-04-18 DIAGNOSIS — Z1231 Encounter for screening mammogram for malignant neoplasm of breast: Secondary | ICD-10-CM | POA: Diagnosis present

## 2014-05-15 ENCOUNTER — Encounter: Payer: Self-pay | Admitting: Cardiology

## 2014-07-17 ENCOUNTER — Ambulatory Visit (INDEPENDENT_AMBULATORY_CARE_PROVIDER_SITE_OTHER): Payer: Medicare Other | Admitting: Cardiology

## 2014-07-17 ENCOUNTER — Encounter: Payer: Self-pay | Admitting: Cardiology

## 2014-07-17 VITALS — BP 130/90 | HR 76 | Ht 63.0 in | Wt 103.0 lb

## 2014-07-17 DIAGNOSIS — I351 Nonrheumatic aortic (valve) insufficiency: Secondary | ICD-10-CM

## 2014-07-17 DIAGNOSIS — I359 Nonrheumatic aortic valve disorder, unspecified: Secondary | ICD-10-CM

## 2014-07-17 NOTE — Patient Instructions (Addendum)
Your physician has requested that you have an echocardiogram. Echocardiography is a painless test that uses sound waves to create images of your heart. It provides your doctor with information about the size and shape of your heart and how well your heart's chambers and valves are working. This procedure takes approximately one hour. There are no restrictions for this procedure.  Your physician wants you to follow-up in: 1 year with Dr. Turner. You will receive a reminder letter in the mail two months in advance. If you don't receive a letter, please call our office to schedule the follow-up appointment.  

## 2014-07-17 NOTE — Progress Notes (Signed)
  [place], [place], [place]  [address] Phone: [phone] Fax:  [phone]  Date:  07/17/2014   ID:  Kelly Grimes, Kelly Grimes 02/01/1941, MRN [id number]  PCP:  Gerrit Heck, MD  Cardiologist:  Fransico Him, MD    History of Present Illness: Kelly Grimes is a 73 y.o. female with a history of mild to moderate AR presents today for followup. She denies any chest pain or DOE. She denies any LE edema, dizziness, palpitations or syncope. She walks a few miles every am and then another mile in the afternoon.   Wt Readings from Last 3 Encounters:  11/15/13 101 lb 6.6 oz (46 kg)  08/07/13 102 lb (46.267 kg)  07/17/13 105 lb (47.628 kg)     Past Medical History  Diagnosis Date  . Osteopenia   . Glaucoma     Pre glaucoma Dr. Ellie Lunch  . Hyperlipidemia   . Melanoma 1993    Left arm Dr Allyson Sabal  . Mild aortic regurgitation 12/11    Mild mitral regurgitation by ECHO   . Headache     Dr. Krista Blue    Current Outpatient Prescriptions  Medication Sig Dispense Refill  . calcium-vitamin D (OSCAL WITH D) 500-200 MG-UNIT per tablet Take 1 tablet by mouth 4 (four) times daily - after meals and at bedtime.     . clobetasol ointment (TEMOVATE) 0.05 %   2  . omega-3 acid ethyl esters (LOVAZA) 1 G capsule Take 1 g by mouth daily.    . Travoprost, BAK Free, (TRAVATAN) 0.004 % SOLN ophthalmic solution Place 1 drop into both eyes at bedtime.    . vitamin C (ASCORBIC ACID) 500 MG tablet Take 1,000 mg by mouth daily.    Marland Kitchen amoxicillin (AMOXIL) 500 MG capsule Take 4 tablets an hour before going to the dentist  0  . Bilberry 60 MG CAPS Take 1 capsule by mouth daily.     No current facility-administered medications for this visit.    Allergies:   No Known Allergies  Social History:  The patient  reports that she has never smoked. She does not have any smokeless tobacco history on file. She reports that she does not drink alcohol or use illicit drugs.   Family History:  The  patient's family history includes Alzheimer's disease in her mother; Pancreatic cancer in her father.   ROS:  Please see the history of present illness.      All other systems reviewed and negative.   PHYSICAL EXAM: VS:  There were no vitals taken for this visit. Well nourished, well developed, in no acute distress HEENT: normal Neck: no JVD Cardiac:  normal S1, S2; RRR; 1/6 diastolic mumur at LUSB Lungs:  clear to auscultation bilaterally, no wheezing, rhonchi or rales Abd: soft, nontender, no hepatomegaly Ext: no edema Skin: warm and dry Neuro:  CNs 2-12 intact, no focal abnormalities noted  EKG:     NSR with no ST changes and occasional PVC's  ASSESSMENT AND PLAN:  1.  Moderate AI by echo 1 year ago - will repeat 2D echo to assess for progression 2.  HTN with borderline control.   At home her BP runs 110/37mmHg  Followup with me in 1 year.   Signed, Fransico Him, MD [facility] 07/17/2014 9:00 AM

## 2014-07-25 ENCOUNTER — Ambulatory Visit (HOSPITAL_COMMUNITY): Payer: Medicare Other | Attending: Cardiovascular Disease | Admitting: Radiology

## 2014-07-25 DIAGNOSIS — I351 Nonrheumatic aortic (valve) insufficiency: Secondary | ICD-10-CM | POA: Diagnosis present

## 2014-07-25 NOTE — Progress Notes (Signed)
Echocardiogram performed.  

## 2014-07-28 ENCOUNTER — Telehealth: Payer: Self-pay

## 2014-07-28 DIAGNOSIS — I351 Nonrheumatic aortic (valve) insufficiency: Secondary | ICD-10-CM

## 2014-07-28 DIAGNOSIS — I359 Nonrheumatic aortic valve disorder, unspecified: Secondary | ICD-10-CM

## 2014-07-28 NOTE — Telephone Encounter (Signed)
Patient informed of ECHO results and verbal understanding expressed.  Instructed pt she will have a repeat ECHO in 1 year.  Patient agrees with treatment plan.

## 2014-07-28 NOTE — Telephone Encounter (Signed)
-----   Message from Sueanne Margarita, MD sent at 07/26/2014 11:59 PM EST ----- Please let patient know that echo showed normal LVF with moderate AR and bileaflet MVP - repeat echo in 1 year

## 2015-03-03 ENCOUNTER — Other Ambulatory Visit: Payer: Self-pay | Admitting: Surgical

## 2015-03-03 MED ORDER — BUPIVACAINE LIPOSOME 1.3 % IJ SUSP: 20.0000 mL | Freq: Once | INTRAMUSCULAR | Status: AC

## 2015-03-17 NOTE — Patient Instructions (Addendum)
YOUR PROCEDURE IS SCHEDULED ON :  03/24/15  REPORT TO [place] MAIN ENTRANCE FOLLOW SIGNS TO EAST ELEVATOR - GO TO 3rd FLOOR CHECK IN AT 3 EAST NURSES STATION (SHORT STAY) AT: 10:15 AM  CALL THIS NUMBER IF YOU HAVE PROBLEMS THE MORNING OF SURGERY [phone]  REMEMBER:ONLY 1 PER PERSON MAY GO TO SHORT STAY WITH YOU TO GET READY THE MORNING OF YOUR SURGERY  DO NOT EAT FOOD  AFTER MIDNIGHT  MAY HAVE CLEAR LIQUIDS UNTIL 6:15 AM  TAKE THESE MEDICINES THE MORNING OF SURGERY: MAY USE EYE DROPS     CLEAR LIQUID DIET  Foods Allowed                                                                     Foods Excluded  Coffee and tea, regular and decaf                             liquids that you cannot  Plain Jell-O in any flavor                                             see through such as: Fruit ices (not with fruit pulp)                                     milk, soups, orange juice  Iced Popsicles                                                 All solid food Carbonated beverages, regular and diet                                    Cranberry, grape and apple juices Sports drinks like Gatorade Lightly seasoned clear broth or consume(fat free) Sugar, honey syrup  _____________________________________________________________________    YOU MAY NOT HAVE ANY METAL ON YOUR BODY INCLUDING HAIR PINS AND PIERCING'S. DO NOT WEAR JEWELRY, MAKEUP, LOTIONS, POWDERS OR PERFUMES. DO NOT WEAR NAIL POLISH. DO NOT SHAVE 48 HRS PRIOR TO SURGERY. MEN MAY SHAVE FACE AND NECK.  DO NOT [place]. [facility] IS NOT RESPONSIBLE FOR VALUABLES.  CONTACTS, DENTURES OR PARTIALS MAY NOT BE WORN TO SURGERY. LEAVE SUITCASE IN CAR. CAN BE BROUGHT TO ROOM AFTER SURGERY.  PATIENTS DISCHARGED THE DAY OF SURGERY WILL NOT BE ALLOWED TO DRIVE HOME.  PLEASE READ OVER THE FOLLOWING INSTRUCTION  SHEETS _________________________________________________________________________________                                           - PREPARING FOR SURGERY  Before surgery, you can play an important role.  Because skin  is not sterile, your skin needs to be as free of germs as possible.  You can reduce the number of germs on your skin by washing with CHG (chlorahexidine gluconate) soap before surgery.  CHG is an antiseptic cleaner which kills germs and bonds with the skin to continue killing germs even after washing. Please DO NOT use if you have an allergy to CHG or antibacterial soaps.  If your skin becomes reddened/irritated stop using the CHG and inform your nurse when you arrive at Short Stay. Do not shave (including legs and underarms) for at least 48 hours prior to the first CHG shower.  You may shave your face. Please follow these instructions carefully:   1.  Shower with CHG Soap the night before surgery and the  morning of Surgery.   2.  If you choose to wash your hair, wash your hair first as usual with your  normal  Shampoo.   3.  After you shampoo, rinse your hair and body thoroughly to remove the  shampoo.                                         4.  Use CHG as you would any other liquid soap.  You can apply chg directly  to the skin and wash . Gently wash with scrungie or clean wascloth    5.  Apply the CHG Soap to your body ONLY FROM THE NECK DOWN.   Do not use on open                           Wound or open sores. Avoid contact with eyes, ears mouth and genitals (private parts).                        Genitals (private parts) with your normal soap.              6.  Wash thoroughly, paying special attention to the area where your surgery  will be performed.   7.  Thoroughly rinse your body with warm water from the neck down.   8.  DO NOT shower/wash with your normal soap after using and rinsing off  the CHG Soap .                9.  Pat yourself dry with a clean  towel.             10.  Wear clean night clothes to bed after shower             11.  Place clean sheets on your bed the night of your first shower and do not  sleep with pets.  Day of Surgery : Do not apply any lotions/deodorants the morning of surgery.  Please wear clean clothes to the hospital/surgery center.  FAILURE TO FOLLOW THESE INSTRUCTIONS MAY RESULT IN THE CANCELLATION OF YOUR SURGERY    PATIENT SIGNATURE_________________________________  ______________________________________________________________________     Adam [place]ix  An incentive spirometer is a tool that can help keep your lungs clear and active. This tool measures how well you are filling your lungs with each breath. Taking long deep breaths may help reverse or decrease the chance of developing breathing (pulmonary) problems (especially infection) following:  A long period of time when you are unable to move or be  active. BEFORE THE PROCEDURE   If the spirometer includes an indicator to show your best effort, your nurse or respiratory therapist will set it to a desired goal.  If possible, sit up straight or lean slightly forward. Try not to slouch.  Hold the incentive spirometer in an upright position. INSTRUCTIONS FOR USE   Sit on the edge of your bed if possible, or sit up as far as you can in bed or on a chair.  Hold the incentive spirometer in an upright position.  Breathe out normally.  Place the mouthpiece in your mouth and seal your lips tightly around it.  Breathe in slowly and as deeply as possible, raising the piston or the ball toward the top of the column.  Hold your breath for 3-5 seconds or for as long as possible. Allow the piston or ball to fall to the bottom of the column.  Remove the mouthpiece from your mouth and breathe out normally.  Rest for a few seconds and repeat Steps 1 through 7 at least 10 times every 1-2 hours when you are awake. Take your time and take a few  normal breaths between deep breaths.  The spirometer may include an indicator to show your best effort. Use the indicator as a goal to work toward during each repetition.  After each set of 10 deep breaths, practice coughing to be sure your lungs are clear. If you have an incision (the cut made at the time of surgery), support your incision when coughing by placing a pillow or rolled up towels firmly against it. Once you are able to get out of bed, walk around indoors and cough well. You may stop using the incentive spirometer when instructed by your caregiver.  RISKS AND COMPLICATIONS  Take your time so you do not get dizzy or light-headed.  If you are in pain, you may need to take or ask for pain medication before doing incentive spirometry. It is harder to take a deep breath if you are having pain. AFTER USE  Rest and breathe slowly and easily.  It can be helpful to keep track of a log of your progress. Your caregiver can provide you with a simple table to help with this. If you are using the spirometer at home, follow these instructions: [place] IF:   You are having difficultly using the spirometer.  You have trouble using the spirometer as often as instructed.  Your pain medication is not giving enough relief while using the spirometer.  You develop fever of 100.5 F (38.1 C) or higher. SEEK IMMEDIATE MEDICAL CARE IF:   You cough up bloody sputum that had not been present before.  You develop fever of 102 F (38.9 C) or greater.  You develop worsening pain at or near the incision site. MAKE SURE YOU:   Understand these instructions.  Will watch your condition.  Will get help right away if you are not doing well or get worse. Document Released: 12/05/2006 Document Revised: 10/17/2011 Document Reviewed: 02/05/2007 Exit[employer] Patient Information 2014 [employer], [place].   ________________________________________________________________________  WHAT IS A BLOOD  TRANSFUSION? Blood Transfusion Information  A transfusion is the replacement of blood or some of its parts. Blood is made up of multiple cells which provide different functions.  Red blood cells carry oxygen and are used for blood loss replacement.  White blood cells fight against infection.  Platelets control bleeding.  Plasma helps clot blood.  Other blood products are available for specialized needs,  such as hemophilia or other clotting disorders. BEFORE THE TRANSFUSION  Who gives blood for transfusions?   Healthy volunteers who are fully evaluated to make sure their blood is safe. This is blood bank blood. Transfusion therapy is the safest it has ever been in the practice of medicine. Before blood is taken from a donor, a complete history is taken to make sure that person has no history of diseases nor engages in risky social behavior (examples are intravenous drug use or sexual activity with multiple partners). The donor's travel history is screened to minimize risk of transmitting infections, such as malaria. The donated blood is tested for signs of infectious diseases, such as HIV and hepatitis. The blood is then tested to be sure it is compatible with you in order to minimize the chance of a transfusion reaction. If you or a relative donates blood, this is often done in anticipation of surgery and is not appropriate for emergency situations. It takes many days to process the donated blood. RISKS AND COMPLICATIONS Although transfusion therapy is very safe and saves many lives, the main dangers of transfusion include:   Getting an infectious disease.  Developing a transfusion reaction. This is an allergic reaction to something in the blood you were given. Every precaution is taken to prevent this. The decision to have a blood transfusion has been considered carefully by your caregiver before blood is given. Blood is not given unless the benefits outweigh the risks. AFTER THE  TRANSFUSION  Right after receiving a blood transfusion, you will usually feel much better and more energetic. This is especially true if your red blood cells have gotten low (anemic). The transfusion raises the level of the red blood cells which carry oxygen, and this usually causes an energy increase.  The nurse administering the transfusion will monitor you carefully for complications. HOME CARE INSTRUCTIONS  No special instructions are needed after a transfusion. You may find your energy is better. Speak with your caregiver about any limitations on activity for underlying diseases you may have. SEEK MEDICAL CARE IF:   Your condition is not improving after your transfusion.  You develop redness or irritation at the intravenous (IV) site. SEEK IMMEDIATE MEDICAL CARE IF:  Any of the following symptoms occur over the next 12 hours:  Shaking chills.  You have a temperature by mouth above 102 F (38.9 C), not controlled by medicine.  Chest, back, or muscle pain.  People around you feel you are not acting correctly or are confused.  Shortness of breath or difficulty breathing.  Dizziness and fainting.  You get a rash or develop hives.  You have a decrease in urine output.  Your urine turns a dark color or changes to pink, red, or brown. Any of the following symptoms occur over the next 10 days:  You have a temperature by mouth above 102 F (38.9 C), not controlled by medicine.  Shortness of breath.  Weakness after normal activity.  The white part of the eye turns yellow (jaundice).  You have a decrease in the amount of urine or are urinating less often.  Your urine turns a dark color or changes to pink, red, or brown. Document Released: 07/22/2000 Document Revised: 10/17/2011 Document Reviewed: 03/10/2008 [facility] Patient Information 2014 [place], [place].  _______________________________________________________________________

## 2015-03-17 NOTE — H&P (Signed)
TOTAL KNEE ADMISSION H&P  Patient is being admitted for right total knee arthroplasty.  Subjective:  Chief Complaint:right knee pain.  HPI: Kelly Grimes, 74 y.o. female, has a history of pain and functional disability in the right knee due to arthritis and has failed non-surgical conservative treatments for greater than 12 weeks to includeNSAID's and/or analgesics, corticosteriod injections and activity modification.  Onset of symptoms was gradual, starting 5 years ago with gradually worsening course since that time. The patient noted no past surgery on the right knee(s).  Patient currently rates pain in the right knee(s) at 8 out of 10 with activity. Patient has night pain, worsening of pain with activity and weight bearing, pain that interferes with activities of daily living, pain with passive range of motion, crepitus and joint swelling.  Patient has evidence of periarticular osteophytes and joint space narrowing by imaging studies.  There is no active infection.  Patient Active Problem List   Diagnosis Date Noted  . Distal radius fracture 11/16/2013  . DOE (dyspnea on exertion) 07/17/2013  . Mild aortic regurgitation   . Aortic valve disorder 07/12/2013  . Observation for suspected cardiovascular disease 07/12/2013   Past Medical History  Diagnosis Date  . Osteopenia   . Glaucoma     Pre glaucoma Dr. Ellie Lunch  . Hyperlipidemia   . Melanoma 1993    Left arm Dr Allyson Sabal  . Mild aortic regurgitation 12/11    Mild mitral regurgitation by ECHO   . Headache     Dr. Krista Blue    Past Surgical History  Procedure Laterality Date  . Joint replacement      hip  . Hernia repair      right inguinal/femoral  . Eye surgery      od cataract  . Open reduction internal fixation (orif) distal radial fracture Right 11/16/2013    Procedure: OPEN REDUCTION INTERNAL FIXATION (ORIF) RIGHT DISTAL RADIUS FRACTURE WITH REPAIR AS NEEDED AND ALLOGRAFT BONE GRAFT;  Surgeon: Roseanne Kaufman, MD;  Location: [facility];  Service: Orthopedics;  Laterality: Right;      Current outpatient prescriptions:  .  amoxicillin (AMOXIL) 500 MG capsule, Take 4 tablets an hour before going to the dentist, Disp: , Rfl: 0 .  brinzolamide (AZOPT) 1 % ophthalmic suspension, Place 1 drop into both eyes 2 (two) times daily., Disp: , Rfl:  .  calcium citrate (CALCITRATE - DOSED IN MG ELEMENTAL CALCIUM) 950 MG tablet, Take 200 mg of elemental calcium by mouth 4 (four) times daily -  with meals and at bedtime., Disp: , Rfl:  .  clobetasol ointment (TEMOVATE) 6.43 %, Apply 1 application topically daily as needed (skin). , Disp: , Rfl: 2 .  omega-3 acid ethyl esters (LOVAZA) 1 G capsule, Take 1 g by mouth every morning. , Disp: , Rfl:  .  Travoprost, BAK Free, (TRAVATAN) 0.004 % SOLN ophthalmic solution, Place 1 drop into both eyes at bedtime., Disp: , Rfl:  .  vitamin C (ASCORBIC ACID) 500 MG tablet, Take 1,000 mg by mouth every morning. , Disp: , Rfl:     No Known Allergies  History  Substance Use Topics  . Smoking status: Never Smoker   . Smokeless tobacco: Not on file  . Alcohol Use: No    Family History  Problem Relation Age of Onset  . Alzheimer's disease Mother   . Pancreatic cancer Father      Review of Systems  Constitutional: Negative.   HENT: Negative for congestion, ear discharge,  ear pain, hearing loss, nosebleeds, sore throat and tinnitus.   Eyes: Negative.   Respiratory: Positive for cough. Negative for hemoptysis, sputum production, shortness of breath, wheezing and stridor.   Cardiovascular: Negative.   Gastrointestinal: Negative.   Genitourinary: Negative.   Musculoskeletal: Positive for joint pain. Negative for myalgias, back pain, falls and neck pain.       Right knee pain  Skin: Negative.   Neurological: Positive for headaches. Negative for dizziness, tingling, tremors, sensory change, speech change, focal weakness, seizures and loss of consciousness.  Endo/Heme/Allergies: Negative.    Psychiatric/Behavioral: Negative.     Objective:  Physical Exam  Constitutional: She is oriented to person, place, and time. She appears well-developed and well-nourished. No distress.  HENT:  Head: Normocephalic and atraumatic.  Right Ear: External ear normal.  Left Ear: External ear normal.  Nose: Nose normal.  Mouth/Throat: Oropharynx is clear and moist.  Eyes: Conjunctivae and EOM are normal.  Neck: Normal range of motion. Neck supple.  Cardiovascular: Normal rate, regular rhythm and intact distal pulses.   Murmur heard.  Systolic murmur is present with a grade of 3/6  Respiratory: Effort normal and breath sounds normal. No respiratory distress. She has no wheezes.  GI: Soft. Bowel sounds are normal. She exhibits no distension. There is no tenderness.  Musculoskeletal:       Right hip: Normal.       Left hip: Normal.       Right knee: She exhibits decreased range of motion and swelling. She exhibits no effusion and no erythema. Tenderness found. Medial joint line and lateral joint line tenderness noted.       Left knee: Normal.  Neurological: She is alert and oriented to person, place, and time. She has normal strength and normal reflexes. No sensory deficit.  Skin: No rash noted. She is not diaphoretic. No erythema.  Psychiatric: She has a normal mood and affect. Her behavior is normal.     Vitals  Weight: 102 lb Height: 63in Body Surface Area: 1.45 m Body Mass Index: 18.07 kg/m  Pulse: 72 (Regular)  BP: 116/70 (Sitting, Left Arm, Standard)  Imaging Review Plain radiographs demonstrate severe degenerative joint disease of the right knee(s). The overall alignment ismild varus. The bone quality appears to be good for age and reported activity level.  Assessment/Plan:  End stage primary osteoarthritis, right knee   The patient history, physical examination, clinical judgment of the provider and imaging studies are consistent with end stage degenerative joint  disease of the right knee(s) and total knee arthroplasty is deemed medically necessary. The treatment options including medical management, injection therapy arthroscopy and arthroplasty were discussed at length. The risks and benefits of total knee arthroplasty were presented and reviewed. The risks due to aseptic loosening, infection, stiffness, patella tracking problems, thromboembolic complications and other imponderables were discussed. The patient acknowledged the explanation, agreed to proceed with the plan and consent was signed. Patient is being admitted for inpatient treatment for surgery, pain control, PT, OT, prophylactic antibiotics, VTE prophylaxis, progressive ambulation and ADL's and discharge planning. The patient is planning to be discharged home with home health services    PCP: Dr. Benjamine Mola Cardio: Dr. Allyson Sabal  TXA IV   Ardeen Jourdain, PA-C

## 2015-03-18 ENCOUNTER — Encounter (HOSPITAL_COMMUNITY): Payer: Self-pay

## 2015-03-18 ENCOUNTER — Inpatient Hospital Stay (HOSPITAL_COMMUNITY): Admission: RE | Admit: 2015-03-18 | Payer: Self-pay | Source: Ambulatory Visit

## 2015-03-18 ENCOUNTER — Ambulatory Visit (HOSPITAL_COMMUNITY)
Admission: RE | Admit: 2015-03-18 | Discharge: 2015-03-18 | Disposition: A | Discharge status: Home / Self Care | Payer: Medicare Other | Source: Ambulatory Visit | Attending: Surgical | Admitting: Surgical

## 2015-03-18 ENCOUNTER — Encounter (HOSPITAL_COMMUNITY)
Admission: RE | Admit: 2015-03-18 | Discharge: 2015-03-18 | Disposition: A | Discharge status: Home / Self Care | Payer: Medicare Other | Source: Ambulatory Visit | Attending: Orthopedic Surgery | Admitting: Orthopedic Surgery

## 2015-03-18 DIAGNOSIS — Z01818 Encounter for other preprocedural examination: Secondary | ICD-10-CM | POA: Insufficient documentation

## 2015-03-18 HISTORY — DX: Spontaneous ecchymoses: R23.3

## 2015-03-18 HISTORY — DX: Unspecified osteoarthritis, unspecified site: M19.90

## 2015-03-18 HISTORY — DX: Other skin changes: R23.8

## 2015-03-18 LAB — CBC WITH DIFFERENTIAL/PLATELET
Basophils Absolute: 0 10*3/uL (ref 0.0–0.1)
Basophils Relative: 0 % (ref 0–1)
Eosinophils Absolute: 0 10*3/uL (ref 0.0–0.7)
Eosinophils Relative: 1 % (ref 0–5)
HCT: 40.5 % (ref 36.0–46.0)
Hemoglobin: 13 g/dL (ref 12.0–15.0)
Lymphocytes Relative: 18 % (ref 12–46)
Lymphs Abs: 1 10*3/uL (ref 0.7–4.0)
MCH: 30.8 pg (ref 26.0–34.0)
MCHC: 32.1 g/dL (ref 30.0–36.0)
MCV: 96 fL (ref 78.0–100.0)
Monocytes Absolute: 0.6 10*3/uL (ref 0.1–1.0)
Monocytes Relative: 11 % (ref 3–12)
Neutro Abs: 3.8 10*3/uL (ref 1.7–7.7)
Neutrophils Relative %: 70 % (ref 43–77)
Platelets: 236 10*3/uL (ref 150–400)
RBC: 4.22 MIL/uL (ref 3.87–5.11)
RDW: 14.7 % (ref 11.5–15.5)
WBC: 5.5 10*3/uL (ref 4.0–10.5)

## 2015-03-18 LAB — COMPREHENSIVE METABOLIC PANEL
ALT: 15 U/L (ref 14–54)
AST: 23 U/L (ref 15–41)
Albumin: 4 g/dL (ref 3.5–5.0)
Alkaline Phosphatase: 35 U/L — ABNORMAL LOW (ref 38–126)
Anion gap: 7 (ref 5–15)
BUN: 16 mg/dL (ref 6–20)
CO2: 26 mmol/L (ref 22–32)
Calcium: 9.1 mg/dL (ref 8.9–10.3)
Chloride: 106 mmol/L (ref 101–111)
Creatinine, Ser: 0.64 mg/dL (ref 0.44–1.00)
GFR calc Af Amer: >60 mL/min (ref >60)
GFR calc non Af Amer: >60 mL/min (ref >60)
Glucose, Bld: 69 mg/dL (ref 65–99)
Potassium: 4.9 mmol/L (ref 3.5–5.1)
Sodium: 139 mmol/L (ref 135–145)
Total Bilirubin: 0.6 mg/dL (ref 0.3–1.2)
Total Protein: 7.4 g/dL (ref 6.5–8.1)

## 2015-03-18 LAB — PROTIME-INR
INR: 0.96 (ref 0.00–1.49)
Prothrombin Time: 12.9 seconds (ref 11.6–15.2)

## 2015-03-18 LAB — APTT: aPTT: 34 seconds (ref 24–37)

## 2015-03-18 LAB — URINALYSIS, ROUTINE W REFLEX MICROSCOPIC
Bilirubin Urine: NEGATIVE
Glucose, UA: NEGATIVE mg/dL
Hgb urine dipstick: NEGATIVE
Ketones, ur: NEGATIVE mg/dL
Leukocytes, UA: NEGATIVE
Nitrite: NEGATIVE
Protein, ur: NEGATIVE mg/dL
Specific Gravity, Urine: 1.025 (ref 1.005–1.030)
Urobilinogen, UA: 0.2 mg/dL (ref 0.0–1.0)
pH: 5 (ref 5.0–8.0)

## 2015-03-18 LAB — SURGICAL PCR SCREEN
MRSA, PCR: NEGATIVE
Staphylococcus aureus: POSITIVE — AB

## 2015-03-18 NOTE — Progress Notes (Signed)
Rx Mupuricin called to [facility] notified

## 2015-03-24 ENCOUNTER — Inpatient Hospital Stay (HOSPITAL_COMMUNITY): Payer: Medicare Other | Admitting: Anesthesiology

## 2015-03-24 ENCOUNTER — Encounter (HOSPITAL_COMMUNITY): Payer: Self-pay | Admitting: *Deleted

## 2015-03-24 ENCOUNTER — Encounter (HOSPITAL_COMMUNITY)
Admission: RE | Disposition: A | Discharge status: Home Health | Payer: Self-pay | Source: Ambulatory Visit | Attending: Orthopedic Surgery

## 2015-03-24 ENCOUNTER — Inpatient Hospital Stay (HOSPITAL_COMMUNITY)
Admission: RE | Admit: 2015-03-24 | Discharge: 2015-03-26 | DRG: 470 | Disposition: A | Discharge status: Home Health | Payer: Medicare Other | Source: Ambulatory Visit | Attending: Orthopedic Surgery | Admitting: Orthopedic Surgery

## 2015-03-24 DIAGNOSIS — I351 Nonrheumatic aortic (valve) insufficiency: Secondary | ICD-10-CM | POA: Diagnosis present

## 2015-03-24 DIAGNOSIS — M8580 Other specified disorders of bone density and structure, unspecified site: Secondary | ICD-10-CM | POA: Diagnosis present

## 2015-03-24 DIAGNOSIS — Z96649 Presence of unspecified artificial hip joint: Secondary | ICD-10-CM | POA: Diagnosis present

## 2015-03-24 DIAGNOSIS — Z681 Body mass index (BMI) 19 or less, adult: Secondary | ICD-10-CM

## 2015-03-24 DIAGNOSIS — M25561 Pain in right knee: Secondary | ICD-10-CM | POA: Diagnosis present

## 2015-03-24 DIAGNOSIS — E785 Hyperlipidemia, unspecified: Secondary | ICD-10-CM | POA: Diagnosis present

## 2015-03-24 DIAGNOSIS — M1711 Unilateral primary osteoarthritis, right knee: Principal | ICD-10-CM | POA: Diagnosis present

## 2015-03-24 DIAGNOSIS — R636 Underweight: Secondary | ICD-10-CM | POA: Diagnosis present

## 2015-03-24 DIAGNOSIS — H409 Unspecified glaucoma: Secondary | ICD-10-CM | POA: Diagnosis present

## 2015-03-24 DIAGNOSIS — Z85820 Personal history of malignant melanoma of skin: Secondary | ICD-10-CM

## 2015-03-24 DIAGNOSIS — I340 Nonrheumatic mitral (valve) insufficiency: Secondary | ICD-10-CM | POA: Diagnosis present

## 2015-03-24 DIAGNOSIS — Z96659 Presence of unspecified artificial knee joint: Secondary | ICD-10-CM

## 2015-03-24 HISTORY — PX: TOTAL KNEE ARTHROPLASTY: SHX125

## 2015-03-24 LAB — TYPE AND SCREEN
ABO/RH(D): A POS
Antibody Screen: NEGATIVE

## 2015-03-24 SURGERY — ARTHROPLASTY, KNEE, TOTAL
Anesthesia: General | Laterality: Right

## 2015-03-24 MED ORDER — LACTATED RINGERS IV SOLN
INTRAVENOUS | Status: DC
  Administered 2015-03-24 – 2015-03-25 (×2): via INTRAVENOUS

## 2015-03-24 MED ORDER — MIDAZOLAM HCL 5 MG/5ML IJ SOLN
INTRAMUSCULAR | Status: DC | PRN
  Administered 2015-03-24: 2 mg via INTRAVENOUS

## 2015-03-24 MED ORDER — MIDAZOLAM HCL 2 MG/2ML IJ SOLN
INTRAMUSCULAR | Status: AC
  Filled 2015-03-24: qty 4

## 2015-03-24 MED ORDER — BUPIVACAINE HCL (PF) 0.25 % IJ SOLN
INTRAMUSCULAR | Status: DC | PRN
  Administered 2015-03-24: 20 mL

## 2015-03-24 MED ORDER — PROMETHAZINE HCL 25 MG/ML IJ SOLN
INTRAMUSCULAR | Status: AC
  Filled 2015-03-24: qty 1

## 2015-03-24 MED ORDER — CEFAZOLIN SODIUM 1-5 GM-% IV SOLN
1.0000 g | Freq: Four times a day (QID) | INTRAVENOUS | Status: AC
Start: 2015-03-24 — End: 2015-03-25
  Administered 2015-03-24 – 2015-03-25 (×2): 1 g via INTRAVENOUS
  Filled 2015-03-24 (×2): qty 50

## 2015-03-24 MED ORDER — PHENOL 1.4 % MT LIQD: 1.0000 | OROMUCOSAL | Status: DC | PRN

## 2015-03-24 MED ORDER — HYDROMORPHONE HCL 1 MG/ML IJ SOLN: 1.0000 mg | INTRAMUSCULAR | Status: DC | PRN

## 2015-03-24 MED ORDER — CHLORHEXIDINE GLUCONATE 4 % EX LIQD: 60.0000 mL | Freq: Once | CUTANEOUS | Status: DC

## 2015-03-24 MED ORDER — FENTANYL CITRATE (PF) 100 MCG/2ML IJ SOLN
INTRAMUSCULAR | Status: AC
  Filled 2015-03-24: qty 4

## 2015-03-24 MED ORDER — POLYMYXIN B SULFATE 500000 UNITS IJ SOLR
INTRAMUSCULAR | Status: DC | PRN
Start: 2015-03-24 — End: 2015-03-24
  Administered 2015-03-24: 500 mL

## 2015-03-24 MED ORDER — PROPOFOL 10 MG/ML IV BOLUS
INTRAVENOUS | Status: AC
  Filled 2015-03-24: qty 20

## 2015-03-24 MED ORDER — THROMBIN 5000 UNITS EX SOLR
CUTANEOUS | Status: AC
  Filled 2015-03-24: qty 5000

## 2015-03-24 MED ORDER — BISACODYL 5 MG PO TBEC: 5.0000 mg | DELAYED_RELEASE_TABLET | Freq: Every day | ORAL | Status: DC | PRN

## 2015-03-24 MED ORDER — BUPIVACAINE HCL (PF) 0.25 % IJ SOLN
INTRAMUSCULAR | Status: AC
  Filled 2015-03-24: qty 30

## 2015-03-24 MED ORDER — FLEET ENEMA 7-19 GM/118ML RE ENEM: 1.0000 | ENEMA | Freq: Once | RECTAL | Status: DC | PRN

## 2015-03-24 MED ORDER — BRINZOLAMIDE 1 % OP SUSP
1.0000 [drp] | Freq: Two times a day (BID) | OPHTHALMIC | Status: DC
  Administered 2015-03-24 – 2015-03-26 (×4): 1 [drp] via OPHTHALMIC
  Filled 2015-03-24: qty 10

## 2015-03-24 MED ORDER — CELECOXIB 200 MG PO CAPS
200.0000 mg | ORAL_CAPSULE | Freq: Two times a day (BID) | ORAL | Status: DC
  Administered 2015-03-24 – 2015-03-26 (×4): 200 mg via ORAL
  Filled 2015-03-24 (×6): qty 1

## 2015-03-24 MED ORDER — LACTATED RINGERS IV SOLN
INTRAVENOUS | Status: DC
  Administered 2015-03-24 (×2): via INTRAVENOUS

## 2015-03-24 MED ORDER — ALUM & MAG HYDROXIDE-SIMETH 200-200-20 MG/5ML PO SUSP: 30.0000 mL | ORAL | Status: DC | PRN

## 2015-03-24 MED ORDER — FENTANYL CITRATE (PF) 100 MCG/2ML IJ SOLN
INTRAMUSCULAR | Status: DC | PRN
  Administered 2015-03-24 (×4): 50 ug via INTRAVENOUS

## 2015-03-24 MED ORDER — DEXAMETHASONE SODIUM PHOSPHATE 10 MG/ML IJ SOLN
INTRAMUSCULAR | Status: AC
  Filled 2015-03-24: qty 1

## 2015-03-24 MED ORDER — ONDANSETRON HCL 4 MG/2ML IJ SOLN
INTRAMUSCULAR | Status: AC
  Filled 2015-03-24: qty 2

## 2015-03-24 MED ORDER — PROPOFOL 10 MG/ML IV BOLUS
INTRAVENOUS | Status: DC | PRN
  Administered 2015-03-24: 100 mg via INTRAVENOUS

## 2015-03-24 MED ORDER — ONDANSETRON HCL 4 MG PO TABS
4.0000 mg | ORAL_TABLET | Freq: Four times a day (QID) | ORAL | Status: DC | PRN
  Administered 2015-03-25: 4 mg via ORAL
  Filled 2015-03-24 (×2): qty 1

## 2015-03-24 MED ORDER — ONDANSETRON HCL 4 MG/2ML IJ SOLN
INTRAMUSCULAR | Status: DC | PRN
  Administered 2015-03-24: 4 mg via INTRAVENOUS

## 2015-03-24 MED ORDER — LATANOPROST 0.005 % OP SOLN
1.0000 [drp] | Freq: Every day | OPHTHALMIC | Status: DC
  Administered 2015-03-24 – 2015-03-25 (×2): 1 [drp] via OPHTHALMIC
  Filled 2015-03-24: qty 2.5

## 2015-03-24 MED ORDER — CEFAZOLIN SODIUM-DEXTROSE 2-3 GM-% IV SOLR
2.0000 g | INTRAVENOUS | Status: AC
  Administered 2015-03-24: 2 g via INTRAVENOUS

## 2015-03-24 MED ORDER — HYDROCODONE-ACETAMINOPHEN 5-325 MG PO TABS
1.0000 | ORAL_TABLET | ORAL | Status: DC | PRN
  Administered 2015-03-25 – 2015-03-26 (×3): 1 via ORAL
  Filled 2015-03-24 (×3): qty 1

## 2015-03-24 MED ORDER — TRANEXAMIC ACID 1000 MG/10ML IV SOLN
1000.0000 mg | INTRAVENOUS | Status: AC
  Administered 2015-03-24: 1000 mg via INTRAVENOUS
  Filled 2015-03-24: qty 10

## 2015-03-24 MED ORDER — ACETAMINOPHEN 325 MG PO TABS
650.0000 mg | ORAL_TABLET | Freq: Four times a day (QID) | ORAL | Status: DC | PRN
  Administered 2015-03-25 (×2): 650 mg via ORAL
  Filled 2015-03-24 (×2): qty 2

## 2015-03-24 MED ORDER — ACETAMINOPHEN 10 MG/ML IV SOLN
INTRAVENOUS | Status: AC
  Filled 2015-03-24: qty 100

## 2015-03-24 MED ORDER — RIVAROXABAN 10 MG PO TABS
10.0000 mg | ORAL_TABLET | Freq: Every day | ORAL | Status: DC
  Administered 2015-03-25 – 2015-03-26 (×2): 10 mg via ORAL
  Filled 2015-03-24 (×3): qty 1

## 2015-03-24 MED ORDER — MENTHOL 3 MG MT LOZG: 1.0000 | LOZENGE | OROMUCOSAL | Status: DC | PRN

## 2015-03-24 MED ORDER — CEFAZOLIN SODIUM-DEXTROSE 2-3 GM-% IV SOLR
INTRAVENOUS | Status: AC
  Filled 2015-03-24: qty 50

## 2015-03-24 MED ORDER — FERROUS SULFATE 325 (65 FE) MG PO TABS
325.0000 mg | ORAL_TABLET | Freq: Three times a day (TID) | ORAL | Status: DC
Start: 2015-03-25 — End: 2015-03-26
  Administered 2015-03-25 – 2015-03-26 (×4): 325 mg via ORAL
  Filled 2015-03-24 (×7): qty 1

## 2015-03-24 MED ORDER — HYDROMORPHONE HCL 1 MG/ML IJ SOLN: 0.2500 mg | INTRAMUSCULAR | Status: DC | PRN

## 2015-03-24 MED ORDER — BUPIVACAINE LIPOSOME 1.3 % IJ SUSP
INTRAMUSCULAR | Status: DC | PRN
Start: 2015-03-24 — End: 2015-03-24
  Administered 2015-03-24: 20 mL

## 2015-03-24 MED ORDER — SODIUM CHLORIDE 0.9 % IR SOLN
Status: AC
  Filled 2015-03-24: qty 1

## 2015-03-24 MED ORDER — THROMBIN 5000 UNITS EX SOLR
CUTANEOUS | Status: DC | PRN
Start: 2015-03-24 — End: 2015-03-24
  Administered 2015-03-24: 5 mL via TOPICAL

## 2015-03-24 MED ORDER — LIDOCAINE HCL (CARDIAC) 20 MG/ML IV SOLN
INTRAVENOUS | Status: AC
  Filled 2015-03-24: qty 5

## 2015-03-24 MED ORDER — DEXAMETHASONE SODIUM PHOSPHATE 10 MG/ML IJ SOLN
INTRAMUSCULAR | Status: DC | PRN
  Administered 2015-03-24: 10 mg via INTRAVENOUS

## 2015-03-24 MED ORDER — SODIUM CHLORIDE 0.9 % IJ SOLN
INTRAMUSCULAR | Status: DC | PRN
Start: 2015-03-24 — End: 2015-03-24
  Administered 2015-03-24: 20 mL

## 2015-03-24 MED ORDER — HYDROMORPHONE HCL 1 MG/ML IJ SOLN
INTRAMUSCULAR | Status: DC | PRN
Start: 2015-03-24 — End: 2015-03-24
  Administered 2015-03-24 (×2): 1 mg via INTRAVENOUS

## 2015-03-24 MED ORDER — HYDROMORPHONE HCL 2 MG/ML IJ SOLN
INTRAMUSCULAR | Status: AC
  Filled 2015-03-24: qty 1

## 2015-03-24 MED ORDER — LACTATED RINGERS IV SOLN: INTRAVENOUS | Status: DC

## 2015-03-24 MED ORDER — METHOCARBAMOL 500 MG PO TABS
500.0000 mg | ORAL_TABLET | Freq: Four times a day (QID) | ORAL | Status: DC | PRN
  Administered 2015-03-25: 500 mg via ORAL
  Filled 2015-03-24 (×2): qty 1

## 2015-03-24 MED ORDER — POLYETHYLENE GLYCOL 3350 17 G PO PACK
17.0000 g | PACK | Freq: Every day | ORAL | Status: DC | PRN
Start: 2015-03-24 — End: 2015-03-26

## 2015-03-24 MED ORDER — PROMETHAZINE HCL 25 MG/ML IJ SOLN
6.2500 mg | INTRAMUSCULAR | Status: DC | PRN
  Administered 2015-03-24: 6.25 mg via INTRAVENOUS

## 2015-03-24 MED ORDER — ACETAMINOPHEN 10 MG/ML IV SOLN
INTRAVENOUS | Status: DC | PRN
  Administered 2015-03-24: 1000 mg via INTRAVENOUS

## 2015-03-24 MED ORDER — LIDOCAINE HCL (PF) 2 % IJ SOLN
INTRAMUSCULAR | Status: DC | PRN
  Administered 2015-03-24: 20 mg via INTRADERMAL

## 2015-03-24 MED ORDER — ACETAMINOPHEN 650 MG RE SUPP: 650.0000 mg | Freq: Four times a day (QID) | RECTAL | Status: DC | PRN

## 2015-03-24 MED ORDER — SUCCINYLCHOLINE CHLORIDE 20 MG/ML IJ SOLN
INTRAMUSCULAR | Status: DC | PRN
  Administered 2015-03-24: 80 mg via INTRAVENOUS

## 2015-03-24 MED ORDER — BUPIVACAINE LIPOSOME 1.3 % IJ SUSP
20.0000 mL | Freq: Once | INTRAMUSCULAR | Status: DC
  Filled 2015-03-24: qty 20

## 2015-03-24 MED ORDER — ONDANSETRON HCL 4 MG/2ML IJ SOLN
4.0000 mg | Freq: Four times a day (QID) | INTRAMUSCULAR | Status: DC | PRN
  Administered 2015-03-24: 4 mg via INTRAVENOUS
  Filled 2015-03-24: qty 2

## 2015-03-24 MED ORDER — OXYCODONE-ACETAMINOPHEN 5-325 MG PO TABS: 2.0000 | ORAL_TABLET | ORAL | Status: DC | PRN

## 2015-03-24 MED ORDER — SODIUM CHLORIDE 0.9 % IJ SOLN
INTRAMUSCULAR | Status: AC
  Filled 2015-03-24: qty 20

## 2015-03-24 MED ORDER — METHOCARBAMOL 1000 MG/10ML IJ SOLN
500.0000 mg | Freq: Four times a day (QID) | INTRAVENOUS | Status: DC | PRN
  Administered 2015-03-25: 500 mg via INTRAVENOUS
  Filled 2015-03-24 (×2): qty 5

## 2015-03-24 SURGICAL SUPPLY — 74 items
BAG DECANTER FOR FLEXI CONT (MISCELLANEOUS) IMPLANT
BAG ZIPLOCK 12X15 (MISCELLANEOUS) IMPLANT
BANDAGE ELASTIC 4 VELCRO ST LF (GAUZE/BANDAGES/DRESSINGS) ×2 IMPLANT
BANDAGE ELASTIC 6 VELCRO ST LF (GAUZE/BANDAGES/DRESSINGS) ×2 IMPLANT
BANDAGE ESMARK 6X9 LF (GAUZE/BANDAGES/DRESSINGS) ×1 IMPLANT
BLADE SAG 18X100X1.27 (BLADE) ×2 IMPLANT
BLADE SAW SGTL 11.0X1.19X90.0M (BLADE) ×2 IMPLANT
BNDG ESMARK 6X9 LF (GAUZE/BANDAGES/DRESSINGS) ×2
BONE CEMENT GENTAMICIN (Cement) ×4 IMPLANT
CAP KNEE TOTAL 3 SIGMA ×2 IMPLANT
CEMENT BONE GENTAMICIN 40 (Cement) ×2 IMPLANT
CUFF TOURN SGL QUICK 34 (TOURNIQUET CUFF) ×1
CUFF TRNQT CYL 34X4X40X1 (TOURNIQUET CUFF) ×1 IMPLANT
DECANTER SPIKE VIAL GLASS SM (MISCELLANEOUS) ×2 IMPLANT
DRAPE EXTREMITY T 121X128X90 (DRAPE) ×2 IMPLANT
DRAPE INCISE IOBAN 66X45 STRL (DRAPES) IMPLANT
DRAPE POUCH INSTRU U-SHP 10X18 (DRAPES) ×2 IMPLANT
DRAPE SHEET LG 3/4 BI-LAMINATE (DRAPES) ×2 IMPLANT
DRAPE U-SHAPE 47X51 STRL (DRAPES) ×2 IMPLANT
DRSG AQUACEL AG ADV 3.5X10 (GAUZE/BANDAGES/DRESSINGS) ×2 IMPLANT
DRSG PAD ABDOMINAL 8X10 ST (GAUZE/BANDAGES/DRESSINGS) IMPLANT
DRSG TEGADERM 4X4.75 (GAUZE/BANDAGES/DRESSINGS) ×2 IMPLANT
DURAPREP 26ML APPLICATOR (WOUND CARE) ×2 IMPLANT
ELECT REM PT RETURN 9FT ADLT (ELECTROSURGICAL) ×2
ELECTRODE REM PT RTRN 9FT ADLT (ELECTROSURGICAL) ×1 IMPLANT
EVACUATOR 1/8 PVC DRAIN (DRAIN) ×2 IMPLANT
FACESHIELD WRAPAROUND (MASK) ×10 IMPLANT
GAUZE SPONGE 2X2 8PLY STRL LF (GAUZE/BANDAGES/DRESSINGS) ×1 IMPLANT
GLOVE BIOGEL PI IND STRL 6.5 (GLOVE) ×1 IMPLANT
GLOVE BIOGEL PI IND STRL 8 (GLOVE) ×1 IMPLANT
GLOVE BIOGEL PI INDICATOR 6.5 (GLOVE) ×1
GLOVE BIOGEL PI INDICATOR 8 (GLOVE) ×1
GLOVE ECLIPSE 8.0 STRL XLNG CF (GLOVE) ×4 IMPLANT
GLOVE SURG SS PI 6.5 STRL IVOR (GLOVE) ×2 IMPLANT
GOWN STRL REUS W/TWL LRG LVL3 (GOWN DISPOSABLE) ×2 IMPLANT
GOWN STRL REUS W/TWL XL LVL3 (GOWN DISPOSABLE) ×2 IMPLANT
HANDPIECE INTERPULSE COAX TIP (DISPOSABLE) ×1
IMMOBILIZER KNEE 20 (SOFTGOODS) ×2
IMMOBILIZER KNEE 20 THIGH 36 (SOFTGOODS) ×1 IMPLANT
KIT BASIN OR (CUSTOM PROCEDURE TRAY) ×2 IMPLANT
LIQUID BAND (GAUZE/BANDAGES/DRESSINGS) ×2 IMPLANT
MANIFOLD NEPTUNE II (INSTRUMENTS) ×2 IMPLANT
NDL SAFETY ECLIPSE 18X1.5 (NEEDLE) ×2 IMPLANT
NEEDLE HYPO 18GX1.5 SHARP (NEEDLE) ×2
NEEDLE HYPO 22GX1.5 SAFETY (NEEDLE) ×2 IMPLANT
NS IRRIG 1000ML POUR BTL (IV SOLUTION) IMPLANT
PACK TOTAL JOINT (CUSTOM PROCEDURE TRAY) ×2 IMPLANT
PADDING CAST COTTON 6X4 STRL (CAST SUPPLIES) IMPLANT
PEN SKIN MARKING BROAD (MISCELLANEOUS) ×2 IMPLANT
POSITIONER SURGICAL ARM (MISCELLANEOUS) ×2 IMPLANT
SET HNDPC FAN SPRY TIP SCT (DISPOSABLE) ×1 IMPLANT
SET PAD KNEE POSITIONER (MISCELLANEOUS) ×2 IMPLANT
SPONGE GAUZE 2X2 STER 10/PKG (GAUZE/BANDAGES/DRESSINGS) ×1
SPONGE LAP 18X18 X RAY DECT (DISPOSABLE) IMPLANT
SPONGE SURGIFOAM ABS GEL 100 (HEMOSTASIS) ×2 IMPLANT
STAPLER VISISTAT 35W (STAPLE) IMPLANT
SUCTION FRAZIER 12FR DISP (SUCTIONS) ×2 IMPLANT
SUT BONE WAX W31G (SUTURE) ×2 IMPLANT
SUT MNCRL AB 4-0 PS2 18 (SUTURE) ×2 IMPLANT
SUT VIC AB 1 CT1 27 (SUTURE) ×2
SUT VIC AB 1 CT1 27XBRD ANTBC (SUTURE) ×2 IMPLANT
SUT VIC AB 2-0 CT1 27 (SUTURE) ×3
SUT VIC AB 2-0 CT1 TAPERPNT 27 (SUTURE) ×3 IMPLANT
SUT VLOC 180 0 24IN GS25 (SUTURE) ×2 IMPLANT
SYR 20CC LL (SYRINGE) ×4 IMPLANT
SYR 50ML LL SCALE MARK (SYRINGE) ×2 IMPLANT
TOWEL OR 17X26 10 PK STRL BLUE (TOWEL DISPOSABLE) ×2 IMPLANT
TOWEL OR NON WOVEN STRL DISP B (DISPOSABLE) IMPLANT
TOWER CARTRIDGE SMART MIX (DISPOSABLE) ×2 IMPLANT
TRAY FOLEY W/METER SILVER 14FR (SET/KITS/TRAYS/PACK) ×2 IMPLANT
TRAY FOLEY W/METER SILVER 16FR (SET/KITS/TRAYS/PACK) IMPLANT
WATER STERILE IRR 1500ML POUR (IV SOLUTION) IMPLANT
WRAP KNEE MAXI GEL POST OP (GAUZE/BANDAGES/DRESSINGS) ×2 IMPLANT
YANKAUER SUCT BULB TIP 10FT TU (MISCELLANEOUS) ×2 IMPLANT

## 2015-03-24 NOTE — Anesthesia Procedure Notes (Signed)
Procedure Name: Intubation Date/Time: 03/24/2015 12:39 PM Performed by: Lajuana Carry E Pre-anesthesia Checklist: Patient identified, Emergency Drugs available, Suction available and Patient being monitored Patient Re-evaluated:Patient Re-evaluated prior to inductionOxygen Delivery Method: Circle System Utilized Preoxygenation: Pre-oxygenation with 100% oxygen Intubation Type: IV induction Ventilation: Mask ventilation without difficulty Laryngoscope Size: Mac and 3 Grade View: Grade II Tube type: Oral Tube size: 6.5 mm Number of attempts: 1 (Intubated by Astrid Drafts, paramedic student) Airway Equipment and Method: Stylet Placement Confirmation: ETT inserted through vocal cords under direct vision,  positive ETCO2 and breath sounds checked- equal and bilateral Secured at: 21 cm Tube secured with: Tape Dental Injury: Teeth and Oropharynx as per pre-operative assessment and Injury to lip

## 2015-03-24 NOTE — Progress Notes (Signed)
Patient responds to voice, oriented x4. VS stable. Compression Wrap in place with ice. General anesthesia used, patient is groggy at this time.Sister at bedside. No s/s of acute distress. No PRN pain medication required. Patient complained of nausea upon arrival to the floor but fell asleep. Clear liquid house tray ordered for her.

## 2015-03-24 NOTE — Interval H&P Note (Signed)
History and Physical Interval Note:  03/24/2015 12:17 PM  Kelly Grimes  has presented today for surgery, with the diagnosis of OA RIGHT KNEE  The various methods of treatment have been discussed with the patient and family. After consideration of risks, benefits and other options for treatment, the patient has consented to  Procedure(s): RIGHT TOTAL KNEE ARTHROPLASTY (Right) as a surgical intervention .  The patient's history has been reviewed, patient examined, no change in status, stable for surgery.  I have reviewed the patient's chart and labs.  Questions were answered to the patient's satisfaction.     [name] A

## 2015-03-24 NOTE — Brief Op Note (Signed)
03/24/2015  2:10 PM  PATIENT:  Kelly Grimes  74 y.o. female  PRE-OPERATIVE DIAGNOSIS:Primary  OA RIGHT KNEE  POST-OPERATIVE DIAGNOSIS :Primary  OA RIGHT KNEE  PROCEDURE:  Procedure(s): RIGHT TOTAL KNEE ARTHROPLASTY (Right)  SURGEON:  Surgeon(s) and Role:    * Latanya Maudlin, MD - Primary  PHYSICIAN ASSISTANT: Ardeen Jourdain PA  ASSISTANTS: Ardeen Jourdain PA ANESTHESIA:   general  EBL:  Total I/O In: 1000 [I.V.:1000] Out: -   BLOOD ADMINISTERED:none  DRAINS: (ONE) Hemovact drain(s) in the Right Knee with  Suction Open   LOCAL MEDICATIONS USED:  MARCAINE 20cc of 0.25%,plain and 20cc of Exparel mixed with 20cc of Normal Saline.    SPECIMEN:  No Specimen  DISPOSITION OF SPECIMEN:  N/A  COUNTS:  YES  TOURNIQUET:  * Missing tourniquet times found for documented tourniquets in log:  [id number] *  DICTATION: .Other Dictation: Dictation Number [id number]  PLAN OF CARE: Admit to inpatient   PATIENT DISPOSITION:  Stable in OR   Delay start of Pharmacological VTE agent (>24hrs) due to surgical blood loss or risk of bleeding: yes

## 2015-03-24 NOTE — Anesthesia Postprocedure Evaluation (Signed)
  Anesthesia Post-op Note  Patient: Kelly Grimes  Procedure(s) Performed: Procedure(s) (LRB): RIGHT TOTAL KNEE ARTHROPLASTY (Right)  Patient Location: PACU  Anesthesia Type: General  Level of Consciousness: awake and alert   Airway and Oxygen Therapy: Patient Spontanous Breathing  Post-op Pain: mild  Post-op Assessment: Post-op Vital signs reviewed, Patient's Cardiovascular Status Stable, Respiratory Function Stable, Patent Airway and No signs of Nausea or vomiting  Last Vitals:  Filed Vitals:   03/24/15 1500  BP: 152/73  Pulse: 98  Temp:   Resp: 16    Post-op Vital Signs: stable   Complications: No apparent anesthesia complications

## 2015-03-24 NOTE — Anesthesia Preprocedure Evaluation (Addendum)
Anesthesia Evaluation  Patient identified by MRN, date of birth, ID band Patient awake    Reviewed: Allergy & Precautions, H&P , NPO status , Patient's Chart, lab work & pertinent test results  Airway Mallampati: II  TM Distance: >3 FB Neck ROM: full    Dental  (+) Dental Advisory Given, Caps 2 upper front are capped:   Pulmonary shortness of breath and with exertion,  breath sounds clear to auscultation  Pulmonary exam normal       Cardiovascular Exercise Tolerance: Good Normal cardiovascular exam+ Valvular Problems/Murmurs MR Rhythm:regular Rate:Normal     Neuro/Psych glaucoma negative neurological ROS  negative psych ROS   GI/Hepatic negative GI ROS, Neg liver ROS,   Endo/Other  negative endocrine ROS  Renal/GU negative Renal ROS  negative genitourinary   Musculoskeletal   Abdominal   Peds  Hematology negative hematology ROS (+)   Anesthesia Other Findings   Reproductive/Obstetrics negative OB ROS                            Anesthesia Physical Anesthesia Plan  ASA: III  Anesthesia Plan: General   Post-op Pain Management:    Induction: Intravenous  Airway Management Planned: Oral ETT  Additional Equipment:   Intra-op Plan:   Post-operative Plan: Extubation in OR  Informed Consent: I have reviewed the patients History and Physical, chart, labs and discussed the procedure including the risks, benefits and alternatives for the proposed anesthesia with the patient or authorized representative who has indicated his/her understanding and acceptance.   Dental Advisory Given  Plan Discussed with: CRNA and Surgeon  Anesthesia Plan Comments:         Anesthesia Quick Evaluation

## 2015-03-24 NOTE — Interval H&P Note (Signed)
History and Physical Interval Note:  03/24/2015 12:17 PM  Kelly Grimes  has presented today for surgery, with the diagnosis of OA RIGHT KNEE  The various methods of treatment have been discussed with the patient and family. After consideration of risks, benefits and other options for treatment, the patient has consented to  Procedure(s): RIGHT TOTAL KNEE ARTHROPLASTY (Right) as a surgical intervention .  The patient's history has been reviewed, patient examined, no change in status, stable for surgery.  I have reviewed the patient's chart and labs.  Questions were answered to the patient's satisfaction.     [name] A

## 2015-03-24 NOTE — Progress Notes (Signed)
Patient has been taking Amoxicillin for sinus infection. Feels much better now.

## 2015-03-24 NOTE — Transfer of Care (Signed)
Immediate Anesthesia Transfer of Care Note  Patient: Kelly Grimes  Procedure(s) Performed: Procedure(s): RIGHT TOTAL KNEE ARTHROPLASTY (Right)  Patient Location: PACU  Anesthesia Type:General  Level of Consciousness:  sedated, patient cooperative and responds to stimulation  Airway & Oxygen Therapy:Patient Spontanous Breathing and Patient connected to face mask oxgen  Post-op Assessment:  Report given to PACU RN and Post -op Vital signs reviewed and stable  Post vital signs:  Reviewed and stable  Last Vitals:  Filed Vitals:   03/24/15 1020  BP: 123/75  Pulse: 88  Temp: 36.7 C  Resp: 16    Complications: No apparent anesthesia complications

## 2015-03-25 ENCOUNTER — Encounter (HOSPITAL_COMMUNITY): Payer: Self-pay | Admitting: Orthopedic Surgery

## 2015-03-25 LAB — CBC
HCT: 33.1 % — ABNORMAL LOW (ref 36.0–46.0)
Hemoglobin: 10.4 g/dL — ABNORMAL LOW (ref 12.0–15.0)
MCH: 29.4 pg (ref 26.0–34.0)
MCHC: 31.4 g/dL (ref 30.0–36.0)
MCV: 93.5 fL (ref 78.0–100.0)
PLATELETS: 202 10*3/uL (ref 150–400)
RBC: 3.54 MIL/uL — ABNORMAL LOW (ref 3.87–5.11)
RDW: 13.9 % (ref 11.5–15.5)
WBC: 9.5 10*3/uL (ref 4.0–10.5)

## 2015-03-25 LAB — BASIC METABOLIC PANEL
ANION GAP: 6 (ref 5–15)
BUN: 11 mg/dL (ref 6–20)
CALCIUM: 8.6 mg/dL — AB (ref 8.9–10.3)
CO2: 26 mmol/L (ref 22–32)
CREATININE: 0.51 mg/dL (ref 0.44–1.00)
Chloride: 103 mmol/L (ref 101–111)
Glucose, Bld: 93 mg/dL (ref 65–99)
Potassium: 4.4 mmol/L (ref 3.5–5.1)
Sodium: 135 mmol/L (ref 135–145)

## 2015-03-25 NOTE — Care Management Note (Addendum)
Case Management Note  Patient Details  Name: BRETTE CAST MRN: [id number] Date of Birth: [birth year]  Subjective/Objective:                   RIGHT TOTAL KNEE ARTHROPLASTY (Right) Action/Plan: Discharge planning  Expected Discharge Date:  03/26/15               Expected Discharge Plan:  [place]  In-House Referral:     Discharge planning Services  CM Consult  Post Acute Care Choice:    Choice offered to:  Patient  DME Arranged:  None needed  HH Arranged:  PT HH Agency:  [facility]  Status of Service:  Completed, signed off  Medicare Important Message Given:    Date Medicare IM Given:    Medicare IM give by:    Date Additional Medicare IM Given:    Additional Medicare Important Message give by:     If discussed at [place] of Stay Meetings, dates discussed:    Additional Comments: Cm met with pt in room to offer choice of home health agency.  Address and contact information verified by pt.  Referral given to [employer], Tim.  Pt states she has DME at home from her mother.  No other CM needs were communicated. 03/25/2015, 12:02 PM

## 2015-03-25 NOTE — Op Note (Signed)
NAMELARYN, Kelly Grimes             ACCOUNT NO.:  [id number]  MEDICAL RECORD NO.:  [id number]  LOCATION:  8756                         FACILITY:  [facility]  PHYSICIAN:  Kipp Brood. Hall Birchard, M.D.DATE OF BIRTH:  1940/12/06  DATE OF PROCEDURE:  03/24/2015 DATE OF DISCHARGE:                              OPERATIVE REPORT   SURGEON:  Kipp Brood. Gladstone Lighter, M.D.  ASSISTANT:  Ardeen Jourdain, [place]  PREOPERATIVE DIAGNOSIS:  Bone-on-bone primary osteoarthritis of the right knee.  POSTOPERATIVE DIAGNOSIS:  Bone-on-bone primary osteoarthritis of the right knee.  OPERATION:  Right total knee arthroplasty utilizing DePuy system.  All 3 components were cemented.  Gentamicin was used in the cement.  The size of the femur was a size 3 right posterior cruciate sacrificing femoral component.  The tibial tray was a size 3, the insert was a size 3, 12.5 mm thickness rotating platform polyethylene insert.  Patella was a size 38 patella.  DESCRIPTION OF PROCEDURE:  Under general anesthesia, routine orthopedic prep and draping of the right lower extremity was carried out. Appropriate time-out was first carried out.  I also marked the appropriate right leg in the holding area.  At this time, the leg was exsanguinated with Esmarch, tourniquet was elevated only at 300 mmHg. At this time, an anterior approach of the knee was carried out with the right leg in a DeMayo knee holder.  Two flaps were created.  I then carried out a median parapatellar incision in an usual fashion.  The patella was reflected laterally.  I did medial and lateral meniscectomies and excised the anterior/posterior cruciate ligaments. At this particular point in time, I took a time-out to do a synovectomy as well.  Initial drill hole was made in the intercondylar notch of the femur.  The guide rod was inserted.  Following that, I thoroughly irrigated out the canal.  I removed 11 mm thickness off the distal femur at this time.  At this  particular time, the femur then was measured to be a size 3 right.  We did our anterior-posterior chamfering cuts for a size 3 right femur.  We then directed attention to the tibial plateau. Initial drill hole was made in the tibial plateau in the usual fashion. We then inserted our intramedullary guides and removed 6 mm thickness off the affected medial side of the tibia.  At that particular time, we then removed some posterior spurs, the femoral condyles, thoroughly irrigated out the knee, and then utilized our spacer blocks and felt that a 12.5 mm insert was the best to use as far as stability.  We then continued to prepare the tibia with our keel cut in the usual fashion. We then cut our notch and cut out distal femur.  We inserted our trial components, reduced the knee and had excellent function with a 12.5 mm thickness insert.  We first tried a 10 and it was not satisfactory.  We then did a resurfacing procedure on the patella for a size 38 patella. Three drill holes were made in the articular surface of the patella.  We then removed all trial components, thoroughly water picked out the knee and cemented all 3 components in simultaneously with gentamicin and  cement.  Once the cement was hardened, we removed all loose pieces of cement in the usual fashion.  The knee was taken through motion again, first with a 10 and then a 12.5 mm thickness insert.  We felt 12.5 mm thickness insert was most stable.  We then removed a trial component, water picked out the knee, and then injected 20 mL of Marcaine plain. Following that, we then closed the wound layers in usual fashion after we inserted our permanent size 3, 12.5 mm thickness rotating platform insert.  We then inserted a Hemovac drain after we reduced the knee.  I closed the knee in layers in the usual fashion.  Of note, the patient had 2 g of IV Ancef preop and tranexamic acid preop and 1 g of IV Tylenol.           ______________________________ Kipp Brood. Gladstone Lighter, M.D.     RAG/MEDQ  D:  03/24/2015  T:  03/25/2015  Job:  [id number]

## 2015-03-25 NOTE — Discharge Instructions (Addendum)
INSTRUCTIONS AFTER JOINT REPLACEMENT  ° °o Remove items at home which could result in a fall. This includes throw rugs or furniture in walking pathways °o ICE to the affected joint every three hours while awake for 30 minutes at a time, for at least the first 3-5 days, and then as needed for pain and swelling.  Continue to use ice for pain and swelling. You may notice swelling that will progress down to the foot and ankle.  This is normal after surgery.  Elevate your leg when you are not up walking on it.   °o Continue to use the breathing machine you got in the hospital (incentive spirometer) which will help keep your temperature down.  It is common for your temperature to cycle up and down following surgery, especially at night when you are not up moving around and exerting yourself.  The breathing machine keeps your lungs expanded and your temperature down. ° ° °DIET:  As you were doing prior to hospitalization, we recommend a well-balanced diet. ° °DRESSING / WOUND CARE / SHOWERING ° °Keep the surgical dressing until follow up.  The dressing is water proof, so you can shower without any extra covering.  IF THE DRESSING FALLS OFF or the wound gets wet inside, change the dressing with sterile gauze.  Please use good hand washing techniques before changing the dressing.  Do not use any lotions or creams on the incision until instructed by your surgeon.   ° °ACTIVITY ° °o Increase activity slowly as tolerated, but follow the weight bearing instructions below.   °o No driving for 6 weeks or until further direction given by your physician.  You cannot drive while taking narcotics.  °o No lifting or carrying greater than 10 lbs. until further directed by your surgeon. °o Avoid periods of inactivity such as sitting longer than an hour when not asleep. This helps prevent blood clots.  °o You may return to work once you are authorized by your doctor.  ° ° ° °WEIGHT BEARING  ° °Weight bearing as tolerated with assist  device (walker, cane, etc) as directed, use it as long as suggested by your surgeon or therapist, typically at least 4-6 weeks. ° ° °EXERCISES ° °Results after joint replacement surgery are often greatly improved when you follow the exercise, range of motion and muscle strengthening exercises prescribed by your doctor. Safety measures are also important to protect the joint from further injury. Any time any of these exercises cause you to have increased pain or swelling, decrease what you are doing until you are comfortable again and then slowly increase them. If you have problems or questions, call your caregiver or physical therapist for advice.  ° °Rehabilitation is important following a joint replacement. After just a few days of immobilization, the muscles of the leg can become weakened and shrink (atrophy).  These exercises are designed to build up the tone and strength of the thigh and leg muscles and to improve motion. Often times heat used for twenty to thirty minutes before working out will loosen up your tissues and help with improving the range of motion but do not use heat for the first two weeks following surgery (sometimes heat can increase post-operative swelling).  ° °These exercises can be done on a training (exercise) mat, on the floor, on a table or on a bed. Use whatever works the best and is most comfortable for you.    Use music or television while you are exercising so that   the exercises are a pleasant break in your day. This will make your life better with the exercises acting as a break in your routine that you can look forward to.   Perform all exercises about fifteen times, three times per day or as directed.  You should exercise both the operative leg and the other leg as well. ° °Exercises include: °  °• Quad Sets - Tighten up the muscle on the front of the thigh (Quad) and hold for 5-10 seconds.   °• Straight Leg Raises - With your knee straight (if you were given a brace, keep it on),  lift the leg to 60 degrees, hold for 3 seconds, and slowly lower the leg.  Perform this exercise against resistance later as your leg gets stronger.  °• Leg Slides: Lying on your back, slowly slide your foot toward your buttocks, bending your knee up off the floor (only go as far as is comfortable). Then slowly slide your foot back down until your leg is flat on the floor again.  °• Angel Wings: Lying on your back spread your legs to the side as far apart as you can without causing discomfort.  °• Hamstring Strength:  Lying on your back, push your heel against the floor with your leg straight by tightening up the muscles of your buttocks.  Repeat, but this time bend your knee to a comfortable angle, and push your heel against the floor.  You may put a pillow under the heel to make it more comfortable if necessary.  ° °A rehabilitation program following joint replacement surgery can speed recovery and prevent re-injury in the future due to weakened muscles. Contact your doctor or a physical therapist for more information on knee rehabilitation.  ° ° °CONSTIPATION ° °Constipation is defined medically as fewer than three stools per week and severe constipation as less than one stool per week.  Even if you have a regular bowel pattern at home, your normal regimen is likely to be disrupted due to multiple reasons following surgery.  Combination of anesthesia, postoperative narcotics, change in appetite and fluid intake all can affect your bowels.  ° °YOU MUST use at least one of the following options; they are listed in order of increasing strength to get the job done.  They are all available over the counter, and you may need to use some, POSSIBLY even all of these options:   ° °Drink plenty of fluids (prune juice may be helpful) and high fiber foods °Colace 100 mg by mouth twice a day  °Senokot for constipation as directed and as needed Dulcolax (bisacodyl), take with full glass of water  °Miralax (polyethylene glycol)  once or twice a day as needed. ° °If you have tried all these things and are unable to have a bowel movement in the first 3-4 days after surgery call either your surgeon or your primary doctor.   ° °If you experience loose stools or diarrhea, hold the medications until you stool forms back up.  If your symptoms do not get better within 1 week or if they get worse, check with your doctor.  If you experience "the worst abdominal pain ever" or develop nausea or vomiting, please contact the office immediately for further recommendations for treatment. ° ° °ITCHING:  If you experience itching with your medications, try taking only a single pain pill, or even half a pain pill at a time.  You can also use Benadryl over the counter for itching or also to   help with sleep.  ° °TED HOSE STOCKINGS:  Use stockings on both legs until for at least 2 weeks or as directed by physician office. They may be removed at night for sleeping. ° °MEDICATIONS:  See your medication summary on the “After Visit Summary” that nursing will review with you.  You may have some home medications which will be placed on hold until you complete the course of blood thinner medication.  It is important for you to complete the blood thinner medication as prescribed. ° °PRECAUTIONS:  If you experience chest pain or shortness of breath - call 911 immediately for transfer to the hospital emergency department.  ° °If you develop a fever greater that 101 F, purulent drainage from wound, increased redness or drainage from wound, foul odor from the wound/dressing, or calf pain - CONTACT YOUR SURGEON.   °                                                °FOLLOW-UP APPOINTMENTS:  If you do not already have a post-op appointment, please call the office for an appointment to be seen by your surgeon.  Guidelines for how soon to be seen are listed in your “After Visit Summary”, but are typically between 1-4 weeks after surgery. ° °OTHER INSTRUCTIONS:  ° °Knee  Replacement:  Do not place pillow under knee, focus on keeping the knee straight while resting. CPM instructions: 0-90 degrees, 2 hours in the morning, 2 hours in the afternoon, and 2 hours in the evening. Place foam block, curve side up under heel at all times except when in CPM or when walking.  DO NOT modify, tear, cut, or change the foam block in any way. ° °MAKE SURE YOU:  °• Understand these instructions.  °• Get help right away if you are not doing well or get worse.  ° ° °Thank you for letting us be a part of your medical care team.  It is a privilege we respect greatly.  We hope these instructions will help you stay on track for a fast and full recovery!  ° °Information on my medicine - XARELTO® (Rivaroxaban) ° °This medication education was reviewed with me or my healthcare representative as part of my discharge preparation. ° °Why was Xarelto® prescribed for you? °Xarelto® was prescribed for you to reduce the risk of blood clots forming after orthopedic surgery. The medical term for these abnormal blood clots is venous thromboembolism (VTE). ° °What do you need to know about xarelto® ? °Take your Xarelto® ONCE DAILY at the same time every day. °You may take it either with or without food. ° °If you have difficulty swallowing the tablet whole, you may crush it and mix in applesauce just prior to taking your dose. ° °Take Xarelto® exactly as prescribed by your doctor and DO NOT stop taking Xarelto® without talking to the doctor who prescribed the medication.  Stopping without other VTE prevention medication to take the place of Xarelto® may increase your risk of developing a clot. ° °After discharge, you should have regular check-up appointments with your healthcare provider that is prescribing your Xarelto®.   ° °What do you do if you miss a dose? °If you miss a dose, take it as soon as you remember on the same day then continue your regularly scheduled once daily regimen the next day. Do   not take two  doses of Xarelto® on the same day.  ° °Important Safety Information °A possible side effect of Xarelto® is bleeding. You should call your healthcare provider right away if you experience any of the following: °? Bleeding from an injury or your nose that does not stop. °? Unusual colored urine (red or dark brown) or unusual colored stools (red or black). °? Unusual bruising for unknown reasons. °? A serious fall or if you hit your head (even if there is no bleeding). ° °Some medicines may interact with Xarelto® and might increase your risk of bleeding while on Xarelto®. To help avoid this, consult your healthcare provider or pharmacist prior to using any new prescription or non-prescription medications, including herbals, vitamins, non-steroidal anti-inflammatory drugs (NSAIDs) and supplements. ° °This website has more information on Xarelto®: www.xarelto.com. ° ° ° ° °

## 2015-03-25 NOTE — Progress Notes (Signed)
Physical Therapy Treatment Patient Details Name: Kelly Grimes MRN: [id number] DOB: September 13, 1940 Today's Date: 03/25/2015    History of Present Illness s/p R TKA    PT Comments    Patient is progressing very well. Requires no KI this PM.  Follow Up Recommendations  Home health PT;Supervision - Intermittent     Equipment Recommendations  Rolling walker with 5" wheels;3in1 (PT)    Recommendations for Other Services       Precautions / Restrictions Precautions Precautions: Knee Required Braces or Orthoses: Knee Immobilizer - Right Knee Immobilizer - Right: Discontinue once straight leg raise with < 10 degree lag    Mobility  Bed Mobility   Bed Mobility: Sit to Supine       Sit to supine: Supervision      Transfers Overall transfer level: Needs assistance Equipment used: Rolling walker (2 wheeled) Transfers: Sit to/from Stand Sit to Stand: Min guard Stand pivot transfers: Min assist       General transfer comment: cues for UE/LE placement  Ambulation/Gait Ambulation/Gait assistance: Min guard Ambulation Distance (Feet): 250 Feet Assistive device: Rolling walker (2 wheeled) Gait Pattern/deviations: Step-through pattern;Step-to pattern     General Gait Details: cues for sequence and for step length   Stairs            Wheelchair Mobility    Modified Rankin (Stroke Patients Only)       Balance                                    Cognition Arousal/Alertness: Awake/alert Behavior During Therapy: WFL for tasks assessed/performed Overall Cognitive Status: Within Functional Limits for tasks assessed                      Exercises Total Joint Exercises Ankle Circles/Pumps: AROM;Both;10 reps;Supine Quad Sets: AROM;Both;10 reps;Supine Towel Squeeze: AROM;Right;10 reps;Supine Short Arc Quad: Right;10 reps;AROM;Supine Heel Slides: AROM;Right;10 reps;Supine Hip ABduction/ADduction: AROM;Right;10 reps;Supine Straight Leg  Raises: AAROM;10 reps;Supine    General Comments        Pertinent Vitals/Pain Pain Score: 2  Pain Location: R knee Pain Descriptors / Indicators: Sore Pain Intervention(s): Monitored during session;Premedicated before session;Ice applied    Home Living Family/patient expects to be discharged to:: Private residence   Available Help at Discharge: Family Type of Home: House Home Access: Stairs to enter Entrance Stairs-Rails: None Home Layout: Two level;Able to live on main level with bedroom/bathroom Home Equipment: Cane - single point;Grab bars - tub/shower Additional Comments: does not have shower seat and does not want this    Prior Function Level of Independence: Independent          PT Goals (current goals can now be found in the care plan section) Acute Rehab PT Goals Patient Stated Goal: get back to active lifestyle PT Goal Formulation: With patient Time For Goal Achievement: 03/28/15 Potential to Achieve Goals: Good Progress towards PT goals: Progressing toward goals    Frequency  7X/week    PT Plan Current plan remains appropriate    Co-evaluation             End of Session Equipment Utilized During Treatment: Right knee immobilizer Activity Tolerance: Patient tolerated treatment well Patient left: in bed;with call bell/phone within reach     Time: 1438-1500 PT Time Calculation (min) (ACUTE ONLY): 22 min  Charges:  $Gait Training: 8-22 mins  G Codes:      Claretha Cooper 03/25/2015, 4:51 PM

## 2015-03-25 NOTE — Plan of Care (Signed)
Problem: Consults Goal: Diagnosis- Total Joint Replacement Outcome: Completed/Met Date Met:  03/25/15 Primary Total Knee RIGHT

## 2015-03-25 NOTE — Progress Notes (Signed)
Utilization review completed.  

## 2015-03-25 NOTE — Evaluation (Signed)
Physical Therapy Evaluation Patient Details Name: Kelly Grimes MRN: [id number] DOB: Aug 23, 1940 Today's Date: 03/25/2015   History of Present Illness  s/p R TKA  Clinical Impression  Patient progressing very well POD 1. Patient will benefit from PT to address problems listed in note below.   Follow Up Recommendations Home health PT;Supervision - Intermittent    Equipment Recommendations  Rolling walker with 5" wheels;3in1 (PT)    Recommendations for Other Services       Precautions / Restrictions Precautions Precautions: Knee Required Braces or Orthoses: Knee Immobilizer - Right Knee Immobilizer - Right: Discontinue once straight leg raise with < 10 degree lag      Mobility  Bed Mobility                  Transfers Overall transfer level: Needs assistance Equipment used: Rolling walker (2 wheeled) Transfers: Sit to/from Stand Sit to Stand: Min assist Stand pivot transfers: Min assist       General transfer comment: cues for UE/LE placement  Ambulation/Gait Ambulation/Gait assistance: Min assist Ambulation Distance (Feet): 150 Feet Assistive device: Rolling walker (2 wheeled) Gait Pattern/deviations: Step-through pattern;Step-to pattern     General Gait Details: cues for sequence and for step length  Stairs            Wheelchair Mobility    Modified Rankin (Stroke Patients Only)       Balance                                             Pertinent Vitals/Pain Pain Score: 2  Pain Location: r knee Pain Descriptors / Indicators: Tender;Sore Pain Intervention(s): Monitored during session;Premedicated before session;Ice applied    Home Living Family/patient expects to be discharged to:: Private residence   Available Help at Discharge: Family Type of Home: House Home Access: Stairs to enter Entrance Stairs-Rails: None Entrance Stairs-Number of Steps: 1 Home Layout: Two level;Able to live on main level with  bedroom/bathroom Home Equipment: Cane - single point;Grab bars - tub/shower Additional Comments: does not have shower seat and does not want this    Prior Function Level of Independence: Independent               Hand Dominance        Extremity/Trunk Assessment               Lower Extremity Assessment: RLE deficits/detail RLE Deficits / Details: knee flexion=5-50       Communication      Cognition Arousal/Alertness: Awake/alert Behavior During Therapy: WFL for tasks assessed/performed Overall Cognitive Status: Within Functional Limits for tasks assessed                      General Comments      Exercises Total Joint Exercises Ankle Circles/Pumps: AROM;Both;10 reps Quad Sets: AROM;Both;10 reps Towel Squeeze: AROM;Right;10 reps Short Arc Quad: AROM;Right;10 reps Heel Slides: AROM;Right;10 reps Hip ABduction/ADduction: AROM;Right;10 reps Straight Leg Raises: AAROM;10 reps      Assessment/Plan    PT Assessment Patient needs continued PT services  PT Diagnosis Difficulty walking;Acute pain   PT Problem List Decreased strength;Decreased range of motion;Decreased activity tolerance;Decreased mobility;Pain;Decreased knowledge of use of DME  PT Treatment Interventions DME instruction;Gait training;Stair training;Functional mobility training;Therapeutic activities;Therapeutic exercise;Patient/family education   PT Goals (Current goals can be found in the Care Plan section) Acute Rehab  PT Goals Patient Stated Goal: get back to active lifestyle PT Goal Formulation: With patient Time For Goal Achievement: 03/28/15 Potential to Achieve Goals: Good    Frequency 7X/week   Barriers to discharge        Co-evaluation               End of Session Equipment Utilized During Treatment: Right knee immobilizer Activity Tolerance: Patient tolerated treatment well Patient left: in chair;with call bell/phone within reach Nurse Communication: Mobility  status         Time: 8887-5797 PT Time Calculation (min) (ACUTE ONLY): 29 min   Charges:   PT Evaluation $Initial PT Evaluation Tier I: 1 Procedure PT Treatments $Gait Training: 8-22 mins   PT G Codes:        Claretha Cooper 03/25/2015, 1:46 PM

## 2015-03-25 NOTE — Progress Notes (Signed)
   Subjective: 1 Day Post-Op Procedure(s) (LRB): RIGHT TOTAL KNEE ARTHROPLASTY (Right) Patient reports pain as mild.   Patient seen in rounds for Dr. Gladstone Lighter Patient is well, and has had no acute complaints or problems. Reports minimal pain in right knee. No issues overnight. Using incentive spirometer.  We will start therapy today.  Plan is to go Home after hospital stay.  Objective: Vital signs in last 24 hours: Temp:  [96.6 F (35.9 C)-98.6 F (37 C)] 98.6 F (37 C) (08/17 0551) Pulse Rate:  [67-98] 67 (08/17 0551) Resp:  [10-17] 16 (08/17 0551) BP: (118-158)/(51-75) 118/51 mmHg (08/17 0551) SpO2:  [92 %-100 %] 100 % (08/17 0551) Weight:  [45.813 kg (101 lb)-46.267 kg (102 lb)] 46.267 kg (102 lb) (08/16 1655)  Intake/Output from previous day:  Intake/Output Summary (Last 24 hours) at 03/25/15 0741 Last data filed at 03/25/15 0551  Gross per 24 hour  Intake 3313.34 ml  Output   1685 ml  Net 1628.34 ml     Labs:  Recent Labs  03/25/15 0444  HGB 10.4*    Recent Labs  03/25/15 0444  WBC 9.5  RBC 3.54*  HCT 33.1*  PLT 202    Recent Labs  03/25/15 0444  NA 135  K 4.4  CL 103  CO2 26  BUN 11  CREATININE 0.51  GLUCOSE 93  CALCIUM 8.6*    EXAM General - Patient is Alert and Oriented Extremity - Neurologically intact Intact pulses distally Dorsiflexion/Plantar flexion intact No cellulitis present Compartment soft Dressing - dressing C/D/I Motor Function - intact, moving foot and toes well on exam.  Hemovac pulled without difficulty.  Past Medical History  Diagnosis Date  . Osteopenia   . Glaucoma     Pre glaucoma Dr. Ellie Lunch / "borderline"  . Hyperlipidemia   . Melanoma 1993    Left arm Dr Allyson Sabal  . Mild aortic regurgitation 12/11    Mild mitral regurgitation by ECHO   . Headache     Dr. Krista Blue  . Arthritis   . Bruises easily     Assessment/Plan: 1 Day Post-Op Procedure(s) (LRB): RIGHT TOTAL KNEE ARTHROPLASTY (Right) Active Problems:  History of total knee arthroplasty  Estimated body mass index is 18.65 kg/(m^2) as calculated from the following:   Height as of this encounter: 5\' 2"  (1.575 m).   Weight as of this encounter: 46.267 kg (102 lb). Advance diet Up with therapy D/C IV fluids Plan for discharge tomorrow  DVT Prophylaxis - Xarelto Weight-Bearing as tolerated  D/C O2 and Pulse OX and try on Room [employer] 03/25/2015, 7:41 AM

## 2015-03-25 NOTE — Evaluation (Signed)
Occupational Therapy Evaluation Patient Details Name: Kelly Grimes MRN: [id number] DOB: 1940/08/12 Today's Date: 03/25/2015    History of Present Illness s/p R TKA   Clinical Impression   This 74 year old female was admitted for the above.  Pt was independent with adls prior to admission, and husband will assist with adls.  Will review bathroom transfers on next visit.  Pt does not want DME for bathroom--educated on readiness for standing shower when KI is discontinued.      Follow Up Recommendations  Supervision/Assistance - 24 hour    Equipment Recommendations  None recommended by OT (likely)    Recommendations for Other Services       Precautions / Restrictions Precautions Precautions: Knee Required Braces or Orthoses: Knee Immobilizer - Right Knee Immobilizer - Right: Discontinue once straight leg raise with < 10 degree lag Restrictions Weight Bearing Restrictions: No      Mobility Bed Mobility Overal bed mobility: Needs Assistance Bed Mobility: Supine to Sit     Supine to sit: Min assist     General bed mobility comments: assist for RLE  Transfers Overall transfer level: Needs assistance Equipment used: Rolling walker (2 wheeled) Transfers: Sit to/from [facility] Sit to Stand: Min assist Stand pivot transfers: Min assist       General transfer comment: cues for UE/LE placement    Balance                                            ADL Overall ADL's : Needs assistance/impaired     Grooming: Oral care;Supervision/safety;Standing       Lower Body Bathing: Minimal assistance;Sit to/from stand       Lower Body Dressing: Minimal assistance;Sit to/from stand   Toilet Transfer: Minimal assistance;Stand-pivot (recliner)             General ADL Comments: Pt stood to brush teeth:  still with catheter; will practice bathroom transfers on next visit.  Pt has KI.  Does not want a shower seat. Educated that  she will need to wait to do a standing shower until PT discontinues KI.  She is fine with sponge bathing initially if needed     Vision     Perception     Praxis      Pertinent Vitals/Pain Pain Assessment: 0-10 Pain Score: 2  Pain Location: R knee Pain Descriptors / Indicators: Sore Pain Intervention(s): Limited activity within patient's tolerance;Monitored during session;Premedicated before session;Repositioned;Ice applied     Hand Dominance     Extremity/Trunk Assessment Upper Extremity Assessment Upper Extremity Assessment: Overall WFL for tasks assessed           Communication Communication Communication: No difficulties   Cognition Arousal/Alertness: Awake/alert Behavior During Therapy: WFL for tasks assessed/performed Overall Cognitive Status: Within Functional Limits for tasks assessed                     General Comments       Exercises       Shoulder Instructions      Home Living Family/patient expects to be discharged to:: Private residence Living Arrangements: Spouse/significant other                 Bathroom Shower/Tub: Occupational psychologist: Handicapped height     Home Equipment: Cane - single point;Grab bars - tub/shower  Additional Comments: does not have shower seat and does not want this      Prior Functioning/Environment Level of Independence: Independent             OT Diagnosis: Generalized weakness   OT Problem List: Decreased strength;Decreased activity tolerance;Decreased knowledge of use of DME or AE;Pain   OT Treatment/Interventions: Self-care/ADL training;DME and/or AE instruction;Patient/family education    OT Goals(Current goals can be found in the care plan section) Acute Rehab OT Goals Patient Stated Goal: get back to active lifestyle OT Goal Formulation: With patient Time For Goal Achievement: 04/01/15 Potential to Achieve Goals: Good ADL Goals Pt Will Transfer to Toilet: with  supervision;ambulating (high commode) Pt Will Perform Tub/Shower Transfer: Shower transfer;with min guard assist;ambulating  OT Frequency: Min 2X/week   Barriers to D/C:            Co-evaluation              End of Session    Activity Tolerance: Patient tolerated treatment well Patient left: in chair;with call bell/phone within reach   Time: 0825-0853 OT Time Calculation (min): 28 min Charges:  OT General Charges $OT Visit: 1 Procedure OT Evaluation $Initial OT Evaluation Tier I: 1 Procedure OT Treatments $Self Care/Home Management : 8-22 mins G-Codes:    Arminta Gamm 04-14-15, 9:57 AM  Lesle Chris, OTR/L [phone] April 14, 2015

## 2015-03-26 LAB — BASIC METABOLIC PANEL
Anion gap: 7 (ref 5–15)
BUN: 12 mg/dL (ref 6–20)
CHLORIDE: 105 mmol/L (ref 101–111)
CO2: 27 mmol/L (ref 22–32)
CREATININE: 0.69 mg/dL (ref 0.44–1.00)
Calcium: 8.5 mg/dL — ABNORMAL LOW (ref 8.9–10.3)
Glucose, Bld: 99 mg/dL (ref 65–99)
Potassium: 4.5 mmol/L (ref 3.5–5.1)
SODIUM: 139 mmol/L (ref 135–145)

## 2015-03-26 LAB — CBC
HCT: 30.9 % — ABNORMAL LOW (ref 36.0–46.0)
HEMOGLOBIN: 9.8 g/dL — AB (ref 12.0–15.0)
MCH: 29.9 pg (ref 26.0–34.0)
MCHC: 31.7 g/dL (ref 30.0–36.0)
MCV: 94.2 fL (ref 78.0–100.0)
PLATELETS: 168 10*3/uL (ref 150–400)
RBC: 3.28 MIL/uL — AB (ref 3.87–5.11)
RDW: 14.3 % (ref 11.5–15.5)
WBC: 8.7 10*3/uL (ref 4.0–10.5)

## 2015-03-26 MED ORDER — RIVAROXABAN 10 MG PO TABS: 10.0000 mg | ORAL_TABLET | Freq: Every day | ORAL | Status: DC

## 2015-03-26 MED ORDER — HYDROCODONE-ACETAMINOPHEN 5-325 MG PO TABS: 1.0000 | ORAL_TABLET | ORAL | Status: DC | PRN

## 2015-03-26 MED ORDER — METHOCARBAMOL 500 MG PO TABS: 500.0000 mg | ORAL_TABLET | Freq: Four times a day (QID) | ORAL | Status: DC | PRN

## 2015-03-26 NOTE — Progress Notes (Signed)
Physical Therapy Treatment Patient Details Name: Kelly Grimes MRN: [id number] DOB: 1941/04/24 Today's Date: 03/26/2015    History of Present Illness s/p R TKA    PT Comments    Patient is making excellent progress. Plans Dc today.  Follow Up Recommendations  Home health PT;Supervision - Intermittent     Equipment Recommendations  None recommended by PT    Recommendations for Other Services       Precautions / Restrictions Precautions Precautions: Knee Restrictions Weight Bearing Restrictions: No    Mobility  Bed Mobility         Supine to sit: Modified independent (Device/Increase time) Sit to supine: Modified independent (Device/Increase time)      Transfers   Equipment used: Rolling walker (2 wheeled) Transfers: Sit to/from Stand Sit to Stand: Modified independent (Device/Increase time)         General transfer comment: cues for UE/LE placement  Ambulation/Gait Ambulation/Gait assistance: Supervision Ambulation Distance (Feet): 300 Feet Assistive device: Rolling walker (2 wheeled) Gait Pattern/deviations: Step-through pattern;Step-to pattern     General Gait Details: cues for sequence and for step length   Stairs Stairs: Yes Stairs assistance: Min guard Stair Management: Step to pattern;Forwards;With walker Number of Stairs: 1 General stair comments: cues for sequence  Wheelchair Mobility    Modified Rankin (Stroke Patients Only)       Balance                                    Cognition                            Exercises Total Joint Exercises Ankle Circles/Pumps: AROM;Both;10 reps;Supine Quad Sets: AROM;Both;10 reps;Supine Towel Squeeze: AROM;Right;10 reps;Supine Short Arc Quad: Right;10 reps;AROM;Supine Heel Slides: AROM;Right;10 reps;Supine Hip ABduction/ADduction: AROM;Right;10 reps;Supine Straight Leg Raises: AAROM;10 reps;Supine Long Arc Quad: AROM;Right;10 reps;Seated Knee Flexion:  AROM;10 reps;Seated Goniometric ROM: 10-80 R knee    General Comments        Pertinent Vitals/Pain Pain Score: 2  Pain Location: R knee, incr to 4 with ROM Pain Descriptors / Indicators: Aching;Sore;Tightness Pain Intervention(s): Monitored during session;Premedicated before session;Ice applied    Home Living                      Prior Function            PT Goals (current goals can now be found in the care plan section) Progress towards PT goals: Progressing toward goals    Frequency       PT Plan Current plan remains appropriate    Co-evaluation             End of Session   Activity Tolerance: Patient tolerated treatment well Patient left: in chair;with call bell/phone within reach     Time: 0910-0945 PT Time Calculation (min) (ACUTE ONLY): 35 min  Charges:  $Gait Training: 8-22 mins $Therapeutic Exercise: 8-22 mins                    G Codes:      Claretha Cooper 03/26/2015, 9:52 AM

## 2015-03-26 NOTE — Progress Notes (Signed)
RN reviewed discharge instructions with patient and family. All questions answered.   Paperwork and prescriptions given.   NT rolled patient down with all belongings to family car. 

## 2015-03-26 NOTE — Progress Notes (Signed)
Occupational Therapy Treatment Patient Details Name: Kelly Grimes MRN: [id number] DOB: [birth year] Today's Date: 03/26/2015    History of present illness s/p R TKA   OT comments  Ready to DC from OT standpoint  Follow Up Recommendations  No OT follow up    Equipment Recommendations  None recommended by OT    Recommendations for Other Services      Precautions / Restrictions Precautions Precautions: Knee       Mobility Bed Mobility         Supine to sit: Modified independent (Device/Increase time) Sit to supine: Modified independent (Device/Increase time)   General bed mobility comments: pt in chair  Transfers   Equipment used: Rolling walker (2 wheeled) Transfers: Sit to/from Stand Sit to Stand: Supervision Stand pivot transfers: Supervision       General transfer comment: cues for UE/LE placement    Balance                                   ADL       Grooming: Supervision/safety;Standing       Lower Body Bathing: Supervison/ safety;Sit to/from stand;Cueing for safety       Lower Body Dressing: Supervision/safety;Sit to/from stand   Toilet Transfer: Supervision/safety;RW             General ADL Comments: VC to slow down- pt has tendency to move quickly. Husband will A as able      Vision                     Perception     Praxis      Cognition   Behavior During Therapy: WFL for tasks assessed/performed Overall Cognitive Status: Within Functional Limits for tasks assessed                               General Comments      Pertinent Vitals/ Pain       Pain Score: 3  Pain Location: r knee Pain Descriptors / Indicators: Sore Pain Intervention(s): Monitored during session     Prior Functioning/Environment              Frequency Min 2X/week     Progress Toward Goals  OT Goals(current goals can now be found in the care plan section)  Progress towards OT goals: Progressing toward  goals     Plan Discharge plan remains appropriate       End of Session     Activity Tolerance Patient tolerated treatment well   Patient Left in chair;with call bell/phone within reach   Nurse Communication Mobility status        Time: 0955-1010 OT Time Calculation (min): 15 min  Charges: OT General Charges $OT Visit: 1 Procedure OT Treatments $Self Care/Home Management : 8-22 mins  Sandria Mcenroe D 03/26/2015, 11:35 AM

## 2015-03-26 NOTE — Progress Notes (Signed)
Physical Therapy Treatment Patient Details Name: Kelly Grimes MRN: [id number] DOB: 1941/06/03 Today's Date: 03/26/2015    History of Present Illness s/p R TKA    PT Comments    Instructed in Walker height, instructed in  Sitting with knee flexion/externsion alternatively. Ready for Dc.  Follow Up Recommendations  Home health PT;Supervision - Intermittent     Equipment Recommendations  None recommended by PT    Recommendations for Other Services       Precautions / Restrictions Precautions Precautions: Knee    Mobility  Bed Mobility         Supine to sit: Modified independent (Device/Increase time) Sit to supine: Modified independent (Device/Increase time)   General bed mobility comments: pt in chair  Transfers Overall transfer level: Modified independent Equipment used: Rolling walker (2 wheeled) Transfers: Sit to/from Stand Sit to Stand: Modified independent (Device/Increase time) Stand pivot transfers: Supervision       General transfer comment: cues for UE/LE placement  Ambulation/Gait Ambulation/Gait assistance: Supervision Ambulation Distance (Feet): 400 Feet Assistive device: Rolling walker (2 wheeled) Gait Pattern/deviations: Step-through pattern     General Gait Details: cues for sequence and for step length   Stairs Stairs: Yes Stairs assistance: Min guard Stair Management: Step to pattern;Forwards;With walker Number of Stairs: 1 General stair comments: cues for sequence  Wheelchair Mobility    Modified Rankin (Stroke Patients Only)       Balance                                    Cognition Arousal/Alertness: Awake/alert Behavior During Therapy: WFL for tasks assessed/performed Overall Cognitive Status: Within Functional Limits for tasks assessed                      Exercises     General Comments        Pertinent Vitals/Pain Pain Score: 2  Pain Location: R knee Pain Descriptors /  Indicators: Sore;Tightness Pain Intervention(s): Monitored during session;Premedicated before session    Home Living                      Prior Function            PT Goals (current goals can now be found in the care plan section) Progress towards PT goals: Progressing toward goals    Frequency  7X/week    PT Plan Current plan remains appropriate    Co-evaluation             End of Session   Activity Tolerance: Patient tolerated treatment well Patient left: in chair     Time: 1210-1235 PT Time Calculation (min) (ACUTE ONLY): 25 min  Charges:  $Gait Training: 8-22 mins $Therapeutic Exercise: 8-22 mins $Self Care/Home Management: 04-19-2023                    G Codes:      Claretha Cooper 03/26/2015, 1:12 PM

## 2015-03-26 NOTE — Care Management Important Message (Signed)
Important Message  Patient Details  Name: KIRIN BRANDENBURGER MRN: [id number] Date of Birth: [birth year]   Medicare Important Message Given:  Yes-second notification given    Camillo Flaming 03/26/2015, 1:27 [place] Message  Patient Details  Name: CHELE CORNELL MRN: [id number] Date of Birth: 03-13-1941   Medicare Important Message Given:  Yes-second notification given    Camillo Flaming 03/26/2015, 1:27 PM

## 2015-03-26 NOTE — Progress Notes (Signed)
Subjective: 2 Days Post-Op Procedure(s) (LRB): RIGHT TOTAL KNEE ARTHROPLASTY (Right) Patient reports pain as 1 on 0-10 scale.    Objective: Vital signs in last 24 hours: Temp:  [98.3 F (36.8 C)-99.1 F (37.3 C)] 98.4 F (36.9 C) (08/18 0542) Pulse Rate:  [62-86] 84 (08/18 0542) Resp:  [15-16] 16 (08/18 0542) BP: (106-134)/(50-69) 106/56 mmHg (08/18 0542) SpO2:  [95 %-98 %] 98 % (08/18 0542)  Intake/Output from previous day: 08/17 0701 - 08/18 0700 In: 5573 [P.O.:1040; I.V.:415] Out: 1250 [Urine:1250] Intake/Output this shift:     Recent Labs  03/25/15 0444 03/26/15 0500  HGB 10.4* 9.8*    Recent Labs  03/25/15 0444 03/26/15 0500  WBC 9.5 8.7  RBC 3.54* 3.28*  HCT 33.1* 30.9*  PLT 202 168    Recent Labs  03/25/15 0444 03/26/15 0500  NA 135 139  K 4.4 4.5  CL 103 105  CO2 26 27  BUN 11 12  CREATININE 0.51 0.69  GLUCOSE 93 99  CALCIUM 8.6* 8.5*   No results for input(s): LABPT, INR in the last 72 hours.  Neurovascular intact Dorsiflexion/Plantar flexion intact  Assessment/Plan: 2 Days Post-Op Procedure(s) (LRB): RIGHT TOTAL KNEE ARTHROPLASTY (Right) Up with therapy Discharge home with home health  Delvon Chipps A 03/26/2015, 7:23 AM

## 2015-03-29 NOTE — Discharge Summary (Signed)
Physician Discharge Summary   Patient ID: Kelly Grimes MRN: [id number] DOB/AGE: 02/28/1941 74 y.o.  Admit date: 03/24/2015 Discharge date: 03/26/2015  Primary Diagnosis: Primary osteoarthritis, right knee  Admission Diagnoses:  Past Medical History  Diagnosis Date  . Osteopenia   . Glaucoma     Pre glaucoma Dr. Ellie Lunch / "borderline"  . Hyperlipidemia   . Melanoma 1993    Left arm Dr Allyson Sabal  . Mild aortic regurgitation 12/11    Mild mitral regurgitation by ECHO   . Headache     Dr. Krista Blue  . Arthritis   . Bruises easily    Discharge Diagnoses:   Active Problems:   History of total knee arthroplasty  Estimated body mass index is 18.65 kg/(m^2) as calculated from the following:   Height as of this encounter: '5\' 2"'  (1.575 m).   Weight as of this encounter: 46.267 kg (102 lb).  Procedure:  Procedure(s) (LRB): RIGHT TOTAL KNEE ARTHROPLASTY (Right)   Consults: None  HPI: Kelly Grimes, 74 y.o. female, has a history of pain and functional disability in the right knee due to arthritis and has failed non-surgical conservative treatments for greater than 12 weeks to includeNSAID's and/or analgesics, corticosteriod injections and activity modification. Onset of symptoms was gradual, starting 5 years ago with gradually worsening course since that time. The patient noted no past surgery on the right knee(s). Patient currently rates pain in the right knee(s) at 8 out of 10 with activity. Patient has night pain, worsening of pain with activity and weight bearing, pain that interferes with activities of daily living, pain with passive range of motion, crepitus and joint swelling. Patient has evidence of periarticular osteophytes and joint space narrowing by imaging studies. There is no active infection.  Laboratory Data: Admission on 03/24/2015, Discharged on 03/26/2015  Component Date Value Ref Range Status  . WBC 03/25/2015 9.5  4.0 - 10.5 K/uL Final  . RBC 03/25/2015 3.54*  3.87 - 5.11 MIL/uL Final  . Hemoglobin 03/25/2015 10.4* 12.0 - 15.0 g/dL Final  . HCT 03/25/2015 33.1* 36.0 - 46.0 % Final  . MCV 03/25/2015 93.5  78.0 - 100.0 fL Final  . MCH 03/25/2015 29.4  26.0 - 34.0 pg Final  . MCHC 03/25/2015 31.4  30.0 - 36.0 g/dL Final  . RDW 03/25/2015 13.9  11.5 - 15.5 % Final  . Platelets 03/25/2015 202  150 - 400 K/uL Final  . Sodium 03/25/2015 135  135 - 145 mmol/L Final  . Potassium 03/25/2015 4.4  3.5 - 5.1 mmol/L Final  . Chloride 03/25/2015 103  101 - 111 mmol/L Final  . CO2 03/25/2015 26  22 - 32 mmol/L Final  . Glucose, Bld 03/25/2015 93  65 - 99 mg/dL Final  . BUN 03/25/2015 11  6 - 20 mg/dL Final  . Creatinine, Ser 03/25/2015 0.51  0.44 - 1.00 mg/dL Final  . Calcium 03/25/2015 8.6* 8.9 - 10.3 mg/dL Final  . GFR calc non Af Amer 03/25/2015 >60  >60 mL/min Final  . GFR calc Af Amer 03/25/2015 >60  >60 mL/min Final   Comment: (NOTE) The eGFR has been calculated using the CKD EPI equation. This calculation has not been validated in all clinical situations. eGFR's persistently <60 mL/min signify possible Chronic Kidney Disease.   . Anion gap 03/25/2015 6  5 - 15 Final  . WBC 03/26/2015 8.7  4.0 - 10.5 K/uL Final  . RBC 03/26/2015 3.28* 3.87 - 5.11 MIL/uL Final  . Hemoglobin 03/26/2015 9.8* 12.0 -  15.0 g/dL Final  . HCT 03/26/2015 30.9* 36.0 - 46.0 % Final  . MCV 03/26/2015 94.2  78.0 - 100.0 fL Final  . MCH 03/26/2015 29.9  26.0 - 34.0 pg Final  . MCHC 03/26/2015 31.7  30.0 - 36.0 g/dL Final  . RDW 03/26/2015 14.3  11.5 - 15.5 % Final  . Platelets 03/26/2015 168  150 - 400 K/uL Final  . Sodium 03/26/2015 139  135 - 145 mmol/L Final  . Potassium 03/26/2015 4.5  3.5 - 5.1 mmol/L Final  . Chloride 03/26/2015 105  101 - 111 mmol/L Final  . CO2 03/26/2015 27  22 - 32 mmol/L Final  . Glucose, Bld 03/26/2015 99  65 - 99 mg/dL Final  . BUN 03/26/2015 12  6 - 20 mg/dL Final  . Creatinine, Ser 03/26/2015 0.69  0.44 - 1.00 mg/dL Final  . Calcium  03/26/2015 8.5* 8.9 - 10.3 mg/dL Final  . GFR calc non Af Amer 03/26/2015 >60  >60 mL/min Final  . GFR calc Af Amer 03/26/2015 >60  >60 mL/min Final   Comment: (NOTE) The eGFR has been calculated using the CKD EPI equation. This calculation has not been validated in all clinical situations. eGFR's persistently <60 mL/min signify possible Chronic Kidney Disease.   Georgiann Hahn gap 03/26/2015 7  5 - 15 Final  Hospital Outpatient Visit on 03/18/2015  Component Date Value Ref Range Status  . WBC 03/18/2015 5.5  4.0 - 10.5 K/uL Final  . RBC 03/18/2015 4.22  3.87 - 5.11 MIL/uL Final  . Hemoglobin 03/18/2015 13.0  12.0 - 15.0 g/dL Final  . HCT 03/18/2015 40.5  36.0 - 46.0 % Final  . MCV 03/18/2015 96.0  78.0 - 100.0 fL Final  . MCH 03/18/2015 30.8  26.0 - 34.0 pg Final  . MCHC 03/18/2015 32.1  30.0 - 36.0 g/dL Final  . RDW 03/18/2015 14.7  11.5 - 15.5 % Final  . Platelets 03/18/2015 236  150 - 400 K/uL Final  . Neutrophils Relative % 03/18/2015 70  43 - 77 % Final  . Neutro Abs 03/18/2015 3.8  1.7 - 7.7 K/uL Final  . Lymphocytes Relative 03/18/2015 18  12 - 46 % Final  . Lymphs Abs 03/18/2015 1.0  0.7 - 4.0 K/uL Final  . Monocytes Relative 03/18/2015 11  3 - 12 % Final  . Monocytes Absolute 03/18/2015 0.6  0.1 - 1.0 K/uL Final  . Eosinophils Relative 03/18/2015 1  0 - 5 % Final  . Eosinophils Absolute 03/18/2015 0.0  0.0 - 0.7 K/uL Final  . Basophils Relative 03/18/2015 0  0 - 1 % Final  . Basophils Absolute 03/18/2015 0.0  0.0 - 0.1 K/uL Final  . Sodium 03/18/2015 139  135 - 145 mmol/L Final  . Potassium 03/18/2015 4.9  3.5 - 5.1 mmol/L Final  . Chloride 03/18/2015 106  101 - 111 mmol/L Final  . CO2 03/18/2015 26  22 - 32 mmol/L Final  . Glucose, Bld 03/18/2015 69  65 - 99 mg/dL Final  . BUN 03/18/2015 16  6 - 20 mg/dL Final  . Creatinine, Ser 03/18/2015 0.64  0.44 - 1.00 mg/dL Final  . Calcium 03/18/2015 9.1  8.9 - 10.3 mg/dL Final  . Total Protein 03/18/2015 7.4  6.5 - 8.1 g/dL Final    . Albumin 03/18/2015 4.0  3.5 - 5.0 g/dL Final  . AST 03/18/2015 23  15 - 41 U/L Final  . ALT 03/18/2015 15  14 - 54 U/L Final  .  Alkaline Phosphatase 03/18/2015 35* 38 - 126 U/L Final  . Total Bilirubin 03/18/2015 0.6  0.3 - 1.2 mg/dL Final  . GFR calc non Af Amer 03/18/2015 >60  >60 mL/min Final  . GFR calc Af Amer 03/18/2015 >60  >60 mL/min Final   Comment: (NOTE) The eGFR has been calculated using the CKD EPI equation. This calculation has not been validated in all clinical situations. eGFR's persistently <60 mL/min signify possible Chronic Kidney Disease.   . Anion gap 03/18/2015 7  5 - 15 Final  . Prothrombin Time 03/18/2015 12.9  11.6 - 15.2 seconds Final  . INR 03/18/2015 0.96  0.00 - 1.49 Final  . ABO/RH(D) 03/18/2015 A POS   Final  . Antibody Screen 03/18/2015 NEG   Final  . Sample Expiration 03/18/2015 03/27/2015   Final  . Color, Urine 03/18/2015 YELLOW  YELLOW Final  . APPearance 03/18/2015 CLEAR  CLEAR Final  . Specific Gravity, Urine 03/18/2015 1.025  1.005 - 1.030 Final  . pH 03/18/2015 5.0  5.0 - 8.0 Final  . Glucose, UA 03/18/2015 NEGATIVE  NEGATIVE mg/dL Final  . Hgb urine dipstick 03/18/2015 NEGATIVE  NEGATIVE Final  . Bilirubin Urine 03/18/2015 NEGATIVE  NEGATIVE Final  . Ketones, ur 03/18/2015 NEGATIVE  NEGATIVE mg/dL Final  . Protein, ur 03/18/2015 NEGATIVE  NEGATIVE mg/dL Final  . Urobilinogen, UA 03/18/2015 0.2  0.0 - 1.0 mg/dL Final  . Nitrite 03/18/2015 NEGATIVE  NEGATIVE Final  . Leukocytes, UA 03/18/2015 NEGATIVE  NEGATIVE Final   MICROSCOPIC NOT DONE ON URINES WITH NEGATIVE PROTEIN, BLOOD, LEUKOCYTES, NITRITE, OR GLUCOSE <1000 mg/dL.  Marland Kitchen MRSA, PCR 03/18/2015 NEGATIVE  NEGATIVE Final  . Staphylococcus aureus 03/18/2015 POSITIVE* NEGATIVE Final   Comment:        The Xpert SA Assay (FDA approved for NASAL specimens in patients over 44 years of age), is one component of a comprehensive surveillance program.  Test performance has been validated by  [facility] for patients greater than or equal to 48 year old. It is not intended to diagnose infection nor to guide or monitor treatment.   Marland Kitchen aPTT 03/18/2015 34  24 - 37 seconds Final     X-Rays:Dg Chest 2 View  03/18/2015   CLINICAL DATA:  Preop right total knee replacement.  EXAM: CHEST  2 VIEW  COMPARISON:  October 01, 2011.  FINDINGS: The heart size and mediastinal contours are within normal limits. Both lungs are clear. The visualized skeletal structures are unremarkable.  IMPRESSION: No active cardiopulmonary disease.   Electronically Signed   By: Marijo Conception, M.D.   On: 03/18/2015 09:27    EKG: Orders placed or performed in visit on 07/17/14  . EKG 12-Lead     Hospital Course: Kelly Grimes is a 74 y.o. who was admitted to [facility]. They were brought to the operating room on 03/24/2015 and underwent Procedure(s): RIGHT TOTAL KNEE ARTHROPLASTY.  Patient tolerated the procedure well and was later transferred to the recovery room and then to the orthopaedic floor for postoperative care.  They were given PO and IV analgesics for pain control following their surgery.  They were given 24 hours of postoperative antibiotics of  Anti-infectives    Start     Dose/Rate Route Frequency Ordered Stop   03/24/15 1800  ceFAZolin (ANCEF) IVPB 1 g/50 mL premix     1 g 100 mL/hr over 30 Minutes Intravenous Every 6 hours 03/24/15 1655 03/25/15 0122   03/24/15 1321  polymyxin B 500,000  Units, bacitracin 50,000 Units in sodium chloride irrigation 0.9 % 500 mL irrigation  Status:  Discontinued       As needed 03/24/15 1321 03/24/15 1439   03/24/15 1027  ceFAZolin (ANCEF) IVPB 2 g/50 mL premix     2 g 100 mL/hr over 30 Minutes Intravenous On call to O.R. 03/24/15 1027 03/24/15 1300     and started on DVT prophylaxis in the form of Xarelto.   PT and OT were ordered for total joint protocol.  Discharge planning consulted to help with postop disposition and equipment needs.  Patient  had a good night on the evening of surgery.  They started to get up OOB with therapy on day one. Hemovac drain was pulled without difficulty.  Continued to work with therapy into day two.  The patient had progressed with therapy and meeting their goals.  Incision was healing well.  Patient was seen in rounds and was ready to go home.   Diet: Regular diet Activity:WBAT Follow-up:in 2 weeks Disposition - Home Discharged Condition: stable   Discharge Instructions    Call MD / Call 911    Complete by:  As directed   If you experience chest pain or shortness of breath, CALL 911 and be transported to the hospital emergency room.  If you develope a fever above 101 F, pus (white drainage) or increased drainage or redness at the wound, or calf pain, call your surgeon's office.     Constipation Prevention    Complete by:  As directed   Drink plenty of fluids.  Prune juice may be helpful.  You may use a stool softener, such as Colace (over the counter) 100 mg twice a day.  Use MiraLax (over the counter) for constipation as needed.     Diet general    Complete by:  As directed      Discharge instructions    Complete by:  As directed   [place] items at home which could result in a fall. This includes throw rugs or furniture in walking pathways ICE to the affected joint every three hours while awake for 30 minutes at a time, for at least the first 3-5 days, and then as needed for pain and swelling.  Continue to use ice for pain and swelling. You may notice swelling that will progress down to the foot and ankle.  This is normal after surgery.  Elevate your leg when you are not up walking on it.   Continue to use the breathing machine you got in the hospital (incentive spirometer) which will help keep your temperature down.  It is common for your temperature to cycle up and down following surgery, especially at night when you are not up moving around and exerting  yourself.  The breathing machine keeps your lungs expanded and your temperature down.   DIET:  As you were doing prior to hospitalization, we recommend a well-balanced diet.  DRESSING / WOUND CARE / SHOWERING  Keep the surgical dressing until follow up.  The dressing is water proof, so you can shower without any extra covering.  IF THE DRESSING FALLS OFF or the wound gets wet inside, change the dressing with sterile gauze.  Please use good hand washing techniques before changing the dressing.  Do not use any lotions or creams on the incision until instructed by your surgeon.    ACTIVITY  Increase activity slowly as tolerated, but follow the weight bearing instructions below.  No driving for 6 weeks or until further direction given by your physician.  You cannot drive while taking narcotics.  No lifting or carrying greater than 10 lbs. until further directed by your surgeon. Avoid periods of inactivity such as sitting longer than an hour when not asleep. This helps prevent blood clots.  You may return to work once you are authorized by your doctor.     WEIGHT BEARING   Weight bearing as tolerated with assist device (walker, cane, etc) as directed, use it as long as suggested by your surgeon or therapist, typically at least 4-6 weeks.   EXERCISES  Results after joint replacement surgery are often greatly improved when you follow the exercise, range of motion and muscle strengthening exercises prescribed by your doctor. Safety measures are also important to protect the joint from further injury. Any time any of these exercises cause you to have increased pain or swelling, decrease what you are doing until you are comfortable again and then slowly increase them. If you have problems or questions, call your caregiver or physical therapist for advice.   Rehabilitation is important following a joint replacement. After just a few days of immobilization, the muscles of the leg can become weakened  and shrink (atrophy).  These exercises are designed to build up the tone and strength of the thigh and leg muscles and to improve motion. Often times heat used for twenty to thirty minutes before working out will loosen up your tissues and help with improving the range of motion but do not use heat for the first two weeks following surgery (sometimes heat can increase post-operative swelling).   These exercises can be done on a training (exercise) mat, on the floor, on a table or on a bed. Use whatever works the best and is most comfortable for you.    Use music or television while you are exercising so that the exercises are a pleasant break in your day. This will make your life better with the exercises acting as a break in your routine that you can look forward to.   Perform all exercises about fifteen times, three times per day or as directed.  You should exercise both the operative leg and the other leg as well.   Exercises include:   Quad Sets - Tighten up the muscle on the front of the thigh (Quad) and hold for 5-10 seconds.   Straight Leg Raises - With your knee straight (if you were given a brace, keep it on), lift the leg to 60 degrees, hold for 3 seconds, and slowly lower the leg.  Perform this exercise against resistance later as your leg gets stronger.  Leg Slides: Lying on your back, slowly slide your foot toward your buttocks, bending your knee up off the floor (only go as far as is comfortable). Then slowly slide your foot back down until your leg is flat on the floor again.  Angel Wings: Lying on your back spread your legs to the side as far apart as you can without causing discomfort.  Hamstring Strength:  Lying on your back, push your heel against the floor with your leg straight by tightening up the muscles of your buttocks.  Repeat, but this time bend your knee to a comfortable angle, and push your heel against the floor.  You may put a pillow under the heel to make it more comfortable  if necessary.   A rehabilitation program following joint replacement surgery can speed recovery and prevent re-injury  in the future due to weakened muscles. Contact your doctor or a physical therapist for more information on knee rehabilitation.    CONSTIPATION  Constipation is defined medically as fewer than three stools per week and severe constipation as less than one stool per week.  Even if you have a regular bowel pattern at home, your normal regimen is likely to be disrupted due to multiple reasons following surgery.  Combination of anesthesia, postoperative narcotics, change in appetite and fluid intake all can affect your bowels.   YOU MUST use at least one of the following options; they are listed in order of increasing strength to get the job done.  They are all available over the counter, and you may need to use some, POSSIBLY even all of these options:    Drink plenty of fluids (prune juice may be helpful) and high fiber foods Colace 100 mg by mouth twice a day  Senokot for constipation as directed and as needed Dulcolax (bisacodyl), take with full glass of water  Miralax (polyethylene glycol) once or twice a day as needed.  If you have tried all these things and are unable to have a bowel movement in the first 3-4 days after surgery call either your surgeon or your primary doctor.    If you experience loose stools or diarrhea, hold the medications until you stool forms back up.  If your symptoms do not get better within 1 week or if they get worse, check with your doctor.  If you experience "the worst abdominal pain ever" or develop nausea or vomiting, please contact the office immediately for further recommendations for treatment.   ITCHING:  If you experience itching with your medications, try taking only a single pain pill, or even half a pain pill at a time.  You can also use Benadryl over the counter for itching or also to help with sleep.   TED HOSE STOCKINGS:  Use  stockings on both legs until for at least 2 weeks or as directed by physician office. They may be removed at night for sleeping.  MEDICATIONS:  See your medication summary on the "After Visit Summary" that nursing will review with you.  You may have some home medications which will be placed on hold until you complete the course of blood thinner medication.  It is important for you to complete the blood thinner medication as prescribed.  PRECAUTIONS:  If you experience chest pain or shortness of breath - call 911 immediately for transfer to the hospital emergency department.   If you develop a fever greater that 101 F, purulent drainage from wound, increased redness or drainage from wound, foul odor from the wound/dressing, or calf pain - CONTACT YOUR SURGEON.                                                   FOLLOW-UP APPOINTMENTS:  If you do not already have a post-op appointment, please call the office for an appointment to be seen by your surgeon.  Guidelines for how soon to be seen are listed in your "After Visit Summary", but are typically between 1-4 weeks after surgery.  MAKE SURE YOU:  Understand these instructions.  Get help right away if you are not doing well or get worse.    Thank you for letting us be a part of your medical  care team.  It is a privilege we respect greatly.  We hope these instructions will help you stay on track for a fast and full recovery!     Increase activity slowly as tolerated    Complete by:  As directed             Medication List    STOP taking these medications        calcium citrate 950 MG tablet  Commonly known as:  CALCITRATE - dosed in mg elemental calcium     omega-3 acid ethyl esters 1 G capsule  Commonly known as:  LOVAZA     vitamin C 500 MG tablet  Commonly known as:  ASCORBIC ACID      TAKE these medications        amoxicillin 500 MG capsule  Commonly known as:  AMOXIL  Take 4 tablets an hour before going to the dentist      brinzolamide 1 % ophthalmic suspension  Commonly known as:  AZOPT  Place 1 drop into both eyes 2 (two) times daily.     clobetasol ointment 0.05 %  Commonly known as:  TEMOVATE  Apply 1 application topically daily as needed (skin).     HYDROcodone-acetaminophen 5-325 MG per tablet  Commonly known as:  NORCO/VICODIN  Take 1-2 tablets by mouth every 4 (four) hours as needed (breakthrough pain).     methocarbamol 500 MG tablet  Commonly known as:  ROBAXIN  Take 1 tablet (500 mg total) by mouth every 6 (six) hours as needed for muscle spasms.     rivaroxaban 10 MG Tabs tablet  Commonly known as:  XARELTO  Take 1 tablet (10 mg total) by mouth daily with breakfast.     Travoprost (BAK Free) 0.004 % Soln ophthalmic solution  Commonly known as:  TRAVATAN  Place 1 drop into both eyes at bedtime.           Follow-up Information    Follow up with GIOFFRE,RONALD A, MD. Schedule an appointment as soon as possible for a visit in 2 weeks.   Specialty:  Orthopedic Surgery   Contact information:   [address] [place] [address] [phone]       Signed: Ardeen Jourdain, PA-C Orthopaedic Surgery 03/29/2015, 12:24 PM

## 2015-04-20 ENCOUNTER — Other Ambulatory Visit (HOSPITAL_COMMUNITY): Payer: Self-pay | Admitting: Family Medicine

## 2015-04-20 DIAGNOSIS — Z1231 Encounter for screening mammogram for malignant neoplasm of breast: Secondary | ICD-10-CM

## 2015-04-22 ENCOUNTER — Encounter: Payer: Self-pay | Admitting: Cardiology

## 2015-04-30 ENCOUNTER — Ambulatory Visit (HOSPITAL_COMMUNITY): Payer: Medicare Other

## 2015-05-01 ENCOUNTER — Ambulatory Visit (HOSPITAL_COMMUNITY)
Admission: RE | Admit: 2015-05-01 | Discharge: 2015-05-01 | Disposition: A | Discharge status: Home / Self Care | Payer: Medicare Other | Source: Ambulatory Visit | Attending: Family Medicine | Admitting: Family Medicine

## 2015-05-01 DIAGNOSIS — Z1231 Encounter for screening mammogram for malignant neoplasm of breast: Secondary | ICD-10-CM | POA: Diagnosis present

## 2015-07-17 ENCOUNTER — Ambulatory Visit (INDEPENDENT_AMBULATORY_CARE_PROVIDER_SITE_OTHER): Payer: Medicare Other | Admitting: Cardiology

## 2015-07-17 ENCOUNTER — Encounter: Payer: Self-pay | Admitting: Cardiology

## 2015-07-17 ENCOUNTER — Telehealth: Payer: Self-pay

## 2015-07-17 ENCOUNTER — Ambulatory Visit (HOSPITAL_COMMUNITY): Payer: Medicare Other | Attending: Cardiology

## 2015-07-17 ENCOUNTER — Other Ambulatory Visit: Payer: Self-pay

## 2015-07-17 ENCOUNTER — Other Ambulatory Visit: Payer: Self-pay | Admitting: Cardiology

## 2015-07-17 VITALS — BP 104/62 | HR 63 | Ht 63.0 in | Wt 102.0 lb

## 2015-07-17 DIAGNOSIS — I359 Nonrheumatic aortic valve disorder, unspecified: Secondary | ICD-10-CM

## 2015-07-17 DIAGNOSIS — I351 Nonrheumatic aortic (valve) insufficiency: Secondary | ICD-10-CM

## 2015-07-17 DIAGNOSIS — I10 Essential (primary) hypertension: Secondary | ICD-10-CM | POA: Diagnosis not present

## 2015-07-17 DIAGNOSIS — I059 Rheumatic mitral valve disease, unspecified: Secondary | ICD-10-CM | POA: Insufficient documentation

## 2015-07-17 NOTE — Telephone Encounter (Signed)
-----   Message from Sueanne Margarita, MD sent at 07/17/2015 10:25 AM EST ----- Please let patient know that heart function is normal with moderate AR and mild MVP - no change from prior study.  Repeat in 1 year

## 2015-07-17 NOTE — Patient Instructions (Signed)
Medication Instructions:  Your physician recommends that you continue on your current medications as directed. Please refer to the Current Medication list given to you today.  Labwork: None ordered  Testing/Procedures: None ordered  Follow-Up: Your physician wants you to follow-up in: 1 year with Dr. Radford Pax.  You will receive a reminder letter in the mail two months in advance. If you don't receive a letter, please call our office to schedule the follow-up appointment.   If you need a refill on your cardiac medications before your next appointment, please call your pharmacy.  Thank you for choosing [facility]!!

## 2015-07-17 NOTE — Progress Notes (Signed)
Cardiology Office Note   Date:  07/17/2015   ID:  Kelly Grimes, DOB 1941-05-15, MRN [id number]  PCP:  Gerrit Heck, MD    Chief Complaint  Patient presents with  . aortic regurgitation  . Hypertension      History of Present Illness: Kelly Grimes is a 74 y.o. female with a history of mild to moderate AR presents today for followup. She denies any chest pain or DOE. She denies any LE edema,  palpitations or syncope. Occasionally she will have a feeling of lightheadedness that only lasts a few seconds.  She walks a few miles every am and then another mile in the afternoon.     Past Medical History  Diagnosis Date  . Osteopenia   . Glaucoma     Pre glaucoma Dr. Ellie Lunch / "borderline"  . Hyperlipidemia   . Melanoma ([place]) 1993    Left arm Dr Allyson Sabal  . Mild aortic regurgitation 12/11    Mild mitral regurgitation by ECHO   . Headache     Dr. Krista Blue  . Arthritis   . Bruises easily     Past Surgical History  Procedure Laterality Date  . Joint replacement      hip  . Hernia repair      right inguinal/femoral  . Eye surgery      od cataract  . Open reduction internal fixation (orif) distal radial fracture Right 11/16/2013    Procedure: OPEN REDUCTION INTERNAL FIXATION (ORIF) RIGHT DISTAL RADIUS FRACTURE WITH REPAIR AS NEEDED AND ALLOGRAFT BONE GRAFT;  Surgeon: Roseanne Kaufman, MD;  Location: [facility];  Service: Orthopedics;  Laterality: Right;  . Total knee arthroplasty Right 03/24/2015    Procedure: RIGHT TOTAL KNEE ARTHROPLASTY;  Surgeon: Latanya Maudlin, MD;  Location: [facility];  Service: Orthopedics;  Laterality: Right;     Current Outpatient Prescriptions  Medication Sig Dispense Refill  . brinzolamide (AZOPT) 1 % ophthalmic suspension Place 1 drop into both eyes 2 (two) times daily.    . clobetasol ointment (TEMOVATE) AB-123456789 % Apply 1 application topically daily as needed (skin).   2  . Travoprost, BAK Free, (TRAVATAN) 0.004 % SOLN  ophthalmic solution Place 1 drop into both eyes at bedtime.     No current facility-administered medications for this visit.   Facility-Administered Medications Ordered in Other Visits  Medication Dose Route Frequency Provider Last Rate Last Dose  . bupivacaine liposome (EXPAREL) 1.3 % injection 266 mg  20 mL Infiltration Once [employer], PA-C        Allergies:   Review of patient's allergies indicates no known allergies.    Social History:  The patient  reports that she has never smoked. She does not have any smokeless tobacco history on file. She reports that she does not drink alcohol or use illicit drugs.   Family History:  The patient's family history includes Alzheimer's disease in her mother; Pancreatic cancer in her father.    ROS:  Please see the history of present illness.   Otherwise, review of systems are positive for none.   All other systems are reviewed and negative.    PHYSICAL EXAM: VS:  BP 104/62 mmHg  Pulse 63  Ht 5\' 3"  (1.6 m)  Wt 102 lb (46.267 kg)  BMI 18.07 kg/m2 , BMI Body mass index is 18.07 kg/(m^2). GEN: Well nourished, well developed, in no acute distress HEENT: normal Neck: no  JVD, carotid bruits, or masses Cardiac: RRR; no murmurs, rubs, or gallops,no edema  Respiratory:  clear to auscultation bilaterally, normal work of breathing GI: soft, nontender, nondistended, + BS MS: no deformity or atrophy Skin: warm and dry, no rash Neuro:  Strength and sensation are intact Psych: euthymic mood, full affect   EKG:  EKG was ordered today showing NSR with LAE, LAFB and LVH by voltage    Recent Labs: 03/18/2015: ALT 15 03/26/2015: BUN 12; Creatinine, Ser 0.69; Hemoglobin 9.8*; Platelets 168; Potassium 4.5; Sodium 139    Lipid Panel No results found for: CHOL, TRIG, HDL, CHOLHDL, VLDL, LDLCALC, LDLDIRECT    Wt Readings from Last 3 Encounters:  07/17/15 102 lb (46.267 kg)  03/24/15 102 lb (46.267 kg)  03/18/15 101 lb (45.813 kg)      ASSESSMENT AND PLAN:  1. Moderate AI by echo 1 year ago - 2D echo done today and results are pending 2. HTN - controlled   Current medicines are reviewed at length with the patient today.  The patient does not have concerns regarding medicines.  The following changes have been made:  no change  Labs/ tests ordered today: See above Assessment and Plan No orders of the defined types were placed in this encounter.     Disposition:   FU with me in 1 year  Signed, Sueanne Margarita, MD  07/17/2015 9:20 AM    [facility] [place], [place], [place]  [address] Phone: [phone]; Fax: [phone]

## 2015-07-17 NOTE — Telephone Encounter (Signed)
Discussed at OV this AM. Repeat ECHO ordered for scheduling.

## 2015-11-02 ENCOUNTER — Encounter: Payer: Self-pay | Admitting: Physician Assistant

## 2015-11-02 ENCOUNTER — Ambulatory Visit (INDEPENDENT_AMBULATORY_CARE_PROVIDER_SITE_OTHER): Payer: Medicare Other | Admitting: Physician Assistant

## 2015-11-02 VITALS — BP 110/68 | HR 68 | Ht 63.0 in | Wt 102.4 lb

## 2015-11-02 DIAGNOSIS — R002 Palpitations: Secondary | ICD-10-CM | POA: Diagnosis not present

## 2015-11-02 DIAGNOSIS — I351 Nonrheumatic aortic (valve) insufficiency: Secondary | ICD-10-CM | POA: Diagnosis not present

## 2015-11-02 DIAGNOSIS — I10 Essential (primary) hypertension: Secondary | ICD-10-CM | POA: Diagnosis not present

## 2015-11-02 DIAGNOSIS — I359 Nonrheumatic aortic valve disorder, unspecified: Secondary | ICD-10-CM

## 2015-11-02 LAB — CBC
HEMATOCRIT: 40.1 % (ref 36.0–46.0)
HEMOGLOBIN: 13 g/dL (ref 12.0–15.0)
MCH: 30.1 pg (ref 26.0–34.0)
MCHC: 32.4 g/dL (ref 30.0–36.0)
MCV: 92.8 fL (ref 78.0–100.0)
MPV: 9.7 fL (ref 8.6–12.4)
Platelets: 245 10*3/uL (ref 150–400)
RBC: 4.32 MIL/uL (ref 3.87–5.11)
RDW: 14.1 % (ref 11.5–15.5)
WBC: 5.1 10*3/uL (ref 4.0–10.5)

## 2015-11-02 LAB — COMPREHENSIVE METABOLIC PANEL
ALBUMIN: 4.4 g/dL (ref 3.6–5.1)
ALK PHOS: 32 U/L — AB (ref 33–130)
ALT: 10 U/L (ref 6–29)
AST: 17 U/L (ref 10–35)
BILIRUBIN TOTAL: 0.3 mg/dL (ref 0.2–1.2)
BUN: 17 mg/dL (ref 7–25)
CALCIUM: 9.2 mg/dL (ref 8.6–10.4)
CO2: 28 mmol/L (ref 20–31)
CREATININE: 0.64 mg/dL (ref 0.60–0.93)
Chloride: 105 mmol/L (ref 98–110)
GLUCOSE: 80 mg/dL (ref 65–99)
Potassium: 4.3 mmol/L (ref 3.5–5.3)
SODIUM: 142 mmol/L (ref 135–146)
Total Protein: 7.1 g/dL (ref 6.1–8.1)

## 2015-11-02 LAB — TSH: TSH: 1.09 mIU/L

## 2015-11-02 NOTE — Progress Notes (Signed)
Cardiology Office Note   Date:  11/02/2015   ID:  Kelly Grimes, Kelly Grimes [birth year], MRN [id number]  PCP:  Gerrit Heck, MD  Cardiologist:  Dr. Radford Pax  Chief Complaint: Palpitations    History of Present Illness: Kelly Grimes is a 75 y.o. female who presents for palpitations.  Friday awakened 3 am with rapid heart rate. No chest pain, dizziness or presyncope.Lasted 5 min. Has occurred while washing dishes. Not well working in yard, walking 2-3 miles a day or volunteering at [employer]. It's happening pretty frequently but not quite daily. She is under a lot of stress over the past 6 months since her sister had a stroke. She also drinks 3-4 cups of coffee daily. She does not have any history of thyroid disease.  Patient is followed by Dr. Radford Pax for mild to moderate AI and hypertension. She had 2-D echo 07/2015 that showed moderate AI, no interval change normal systolic function EF [id number] grade 1 diastolic dysfunction. Mild prolapse of the mitral valve.  Past Medical History  Diagnosis Date  . Osteopenia   . Glaucoma     Pre glaucoma Dr. Ellie Lunch / "borderline"  . Hyperlipidemia   . Melanoma ([place]) 1993    Left arm Dr Allyson Sabal  . Mild aortic regurgitation 12/11    Mild mitral regurgitation by ECHO   . Headache     Dr. Krista Blue  . Arthritis   . Bruises easily     Past Surgical History  Procedure Laterality Date  . Joint replacement      hip  . Hernia repair      right inguinal/femoral  . Eye surgery      od cataract  . Open reduction internal fixation (orif) distal radial fracture Right 11/16/2013    Procedure: OPEN REDUCTION INTERNAL FIXATION (ORIF) RIGHT DISTAL RADIUS FRACTURE WITH REPAIR AS NEEDED AND ALLOGRAFT BONE GRAFT;  Surgeon: Roseanne Kaufman, MD;  Location: [facility];  Service: Orthopedics;  Laterality: Right;  . Total knee arthroplasty Right 03/24/2015    Procedure: RIGHT TOTAL KNEE ARTHROPLASTY;  Surgeon: Latanya Maudlin, MD;  Location[facility];  Service: Orthopedics;   Laterality: Right;     Current Outpatient Prescriptions  Medication Sig Dispense Refill  . brinzolamide (AZOPT) 1 % ophthalmic suspension Place 1 drop into both eyes 2 (two) times daily.    . clobetasol ointment (TEMOVATE) AB-123456789 % Apply 1 application topically daily as needed (skin).   2  . Travoprost, BAK Free, (TRAVATAN) 0.004 % SOLN ophthalmic solution Place 1 drop into both eyes at bedtime.     No current facility-administered medications for this visit.   Facility-Administered Medications Ordered in Other Visits  Medication Dose Route Frequency Provider Last Rate Last Dose  . bupivacaine liposome (EXPAREL) 1.3 % injection 266 mg  20 mL Infiltration Once [employer], PA-C        Allergies:   Review of patient's allergies indicates no known allergies.    Social History:  The patient  reports that she has never smoked. She does not have any smokeless tobacco history on file. She reports that she does not drink alcohol or use illicit drugs.   Family History:  The patient's    family history includes Alzheimer's disease in her mother; Pancreatic cancer in her father. Sister had a stroke   ROS:  Please see the history of present illness.   Otherwise, review of systems are positive for occasional chills and cough.   All other systems are reviewed and negative.  PHYSICAL EXAM: VS:  BP 110/68 mmHg  Pulse 68  Ht 5\' 3"  (1.6 m)  Wt 102 lb 6.4 oz (46.448 kg)  BMI 18.14 kg/m2 , BMI Body mass index is 18.14 kg/(m^2). GEN: Thin, in no acute distress Neck: no JVD, HJR, carotid bruits, or masses Cardiac:  RRR; 2/6 diastolic murmur at the left sternal border, no gallop, rubs, thrill or heave,  Respiratory:  clear to auscultation bilaterally, normal work of breathing GI: soft, nontender, nondistended, + BS MS: no deformity or atrophy Extremities: without cyanosis, clubbing, edema, good distal pulses bilaterally.  Skin: warm and dry, no rash Neuro:  Strength and sensation are  intact    EKG:  EKG is ordered today. The ekg ordered today demonstrates normal sinus rhythm with LVH, no acute change   Recent Labs: 03/18/2015: ALT 15 03/26/2015: BUN 12; Creatinine, Ser 0.69; Hemoglobin 9.8*; Platelets 168; Potassium 4.5; Sodium 139    Lipid Panel No results found for: CHOL, TRIG, HDL, CHOLHDL, VLDL, LDLCALC, LDLDIRECT    Wt Readings from Last 3 Encounters:  11/02/15 102 lb 6.4 oz (46.448 kg)  07/17/15 102 lb (46.267 kg)  03/24/15 102 lb (46.267 kg)      Other studies Reviewed: Additional studies/ records that were reviewed today include and review of the records demonstrates:  2-D echo 07/17/15 Study Conclusions   - Left ventricle: The cavity size was normal. Wall thickness was   normal. Systolic function was vigorous. The estimated ejection   fraction was in the range of 65% to 70%. Wall motion was normal;   there were no regional wall motion abnormalities. Doppler   parameters are consistent with abnormal left ventricular   relaxation (grade 1 diastolic dysfunction). - Aortic valve: There was moderate regurgitation. - Mitral valve: Mild prolapse, involving the anterior leaflet.   Impressions:   - Compared to the prior study, there has been no significant   interval change.   Stress Myoview 1/2015Impression Exercise Capacity:  Below average exercise capacity. BP Response:  Normal blood pressure response. Clinical Symptoms:  There is dyspnea. ECG Impression:  No significant ST segment change suggestive of ischemia. Comparison with Prior Nuclear Study: No images to compare  Overall Impression:  Low risk stress nuclear study . No evidence of ischemia.. Below average exercise capacity.  LV Ejection Fraction: 70%.  LV Wall Motion:  NL LV Function; NL Wall Motion  Jettie Booze., MD, [facility]   ASSESSMENT AND PLAN:  Palpitations Patient complains of palpitations over the past month. Her heart beats fast and last about 5 minutes without any  other associated symptoms. She does drink a lot of caffeine. Recommend decrease caffeine intake. We'll check labs including electrolytes and TSH and CBC. Place 2 week event recorder to rule out arrhythmia. Follow-up with Dr. Radford Pax or myself in 3-4 weeks.  Moderate aortic regurgitation Echo stable 07/2015    SignedErmalinda Barrios, PA-C  11/02/2015 3:09 PM    [facility] [place], [place], [place]  [address] Phone: [phone]; Fax: [phone]

## 2015-11-02 NOTE — Assessment & Plan Note (Addendum)
Patient complains of palpitations over the past month. Her heart beats fast and last about 5 minutes without any other associated symptoms. She does drink a lot of caffeine. Recommend decrease caffeine intake. We'll check labs including electrolytes and TSH and CBC. Place 2 week event recorder to rule out arrhythmia. Follow-up with Dr. Radford Pax or myself in 3-4 weeks.

## 2015-11-02 NOTE — Assessment & Plan Note (Addendum)
Echo stable 07/2015

## 2015-11-02 NOTE — Patient Instructions (Signed)
Medication Instructions:   Your physician recommends that you continue on your current medications as directed. Please refer to the Current Medication list given to you today.   If you need a refill on your cardiac medications before your next appointment, please call your pharmacy.  Labwork:  CBC CMET AND TSH    Testing/Procedures:  Your physician has recommended that you wear an event monitor. Event monitors are medical devices that record the heart's electrical activity. Doctors most often [place] these monitors to diagnose arrhythmias. Arrhythmias are problems with the speed or rhythm of the heartbeat. The monitor is a small, portable device. You can wear one while you do your normal daily activities. This is usually used to diagnose what is causing palpitations/syncope (passing out).   Follow-Up:  WITH DR TURNER IN 4 TO 6 WEEKS FOR EVENT MONITOR FOLLOW UP    Any Other Special Instructions Will Be Listed Below (If Applicable).  CUT BACK ON CAFFEINE

## 2015-11-03 ENCOUNTER — Ambulatory Visit (INDEPENDENT_AMBULATORY_CARE_PROVIDER_SITE_OTHER): Payer: Medicare Other

## 2015-11-03 DIAGNOSIS — R002 Palpitations: Secondary | ICD-10-CM | POA: Diagnosis not present

## 2015-11-05 NOTE — Addendum Note (Signed)
Addended by: Freada Bergeron on: 11/05/2015 05:58 PM   Modules accepted: Orders

## 2015-11-23 DIAGNOSIS — S81852A Open bite, left lower leg, initial encounter: Secondary | ICD-10-CM | POA: Diagnosis not present

## 2015-11-23 DIAGNOSIS — W540XXA Bitten by dog, initial encounter: Secondary | ICD-10-CM | POA: Diagnosis not present

## 2015-12-11 ENCOUNTER — Encounter: Payer: Self-pay | Admitting: Cardiology

## 2015-12-11 ENCOUNTER — Ambulatory Visit (INDEPENDENT_AMBULATORY_CARE_PROVIDER_SITE_OTHER): Payer: Medicare Other | Admitting: Cardiology

## 2015-12-11 VITALS — BP 110/74 | HR 68 | Ht 63.0 in | Wt 101.4 lb

## 2015-12-11 DIAGNOSIS — I351 Nonrheumatic aortic (valve) insufficiency: Secondary | ICD-10-CM | POA: Diagnosis not present

## 2015-12-11 DIAGNOSIS — I341 Nonrheumatic mitral (valve) prolapse: Secondary | ICD-10-CM

## 2015-12-11 DIAGNOSIS — I10 Essential (primary) hypertension: Secondary | ICD-10-CM

## 2015-12-11 DIAGNOSIS — R002 Palpitations: Secondary | ICD-10-CM | POA: Diagnosis not present

## 2015-12-11 HISTORY — DX: Nonrheumatic mitral (valve) prolapse: I34.1

## 2015-12-11 NOTE — Progress Notes (Signed)
Cardiology Office Note    Date:  12/13/2015   ID:  Kelly Grimes, DOB 1941-02-02, MRN [id number]  PCP:  Gerrit Heck, MD  Cardiologist:  Sueanne Margarita, MD   Chief Complaint  Patient presents with  . Aortic Insuffiency  . Hypertension    History of Present Illness:  Kelly Grimes is a 75 y.o. female with a history of moderate AR presents today for followup. She was seen by Kelly Husk, PA in March with complaints of palpitations. This usually occurs with anxiety especially when she worries about her sister who has had a CVA and lives alone.  She wore a heart monitor which showed NSR and sinus tachycardia up to 119bpm.  She denies any chest pain or DOE. She denies any LE edema, palpitations or syncope.  She walks a few miles every am and then another mile in the afternoon.    Past Medical History  Diagnosis Date  . Osteopenia   . Glaucoma     Pre glaucoma Dr. Ellie Grimes / "borderline"  . Hyperlipidemia   . Melanoma ([place]) 1993    Left arm Dr Allyson Sabal  . Moderate aortic insufficiency 12/11  . Headache     Dr. Krista Blue  . Arthritis   . Bruises easily   . MVP (mitral valve prolapse) 12/11/2015    Past Surgical History  Procedure Laterality Date  . Joint replacement      hip  . Hernia repair      right inguinal/femoral  . Eye surgery      od cataract  . Open reduction internal fixation (orif) distal radial fracture Right 11/16/2013    Procedure: OPEN REDUCTION INTERNAL FIXATION (ORIF) RIGHT DISTAL RADIUS FRACTURE WITH REPAIR AS NEEDED AND ALLOGRAFT BONE GRAFT;  Surgeon: Kelly Kaufman, MD;  Location: [facility];  Service: Orthopedics;  Laterality: Right;  . Total knee arthroplasty Right 03/24/2015    Procedure: RIGHT TOTAL KNEE ARTHROPLASTY;  Surgeon: Kelly Maudlin, MD;  [facility];  Service: Orthopedics;  Laterality: Right;    Current Medications: Outpatient Prescriptions Prior to Visit  Medication Sig Dispense Refill  . brinzolamide (AZOPT) 1 %  ophthalmic suspension Place 1 drop into both eyes 2 (two) times daily.    . clobetasol ointment (TEMOVATE) AB-123456789 % Apply 1 application topically daily as needed (skin).   2  . Travoprost, BAK Free, (TRAVATAN) 0.004 % SOLN ophthalmic solution Place 1 drop into both eyes at bedtime.     Facility-Administered Medications Prior to Visit  Medication Dose Route Frequency Provider Last Rate Last Dose  . bupivacaine liposome (EXPAREL) 1.3 % injection 266 mg  20 mL Infiltration Once [employer], PA-C         Allergies:   Review of patient's allergies indicates no known allergies.   Social History   Social History  . Marital Status: Married    Spouse Name: N/A  . Number of Children: N/A  . Years of Education: N/A   Social History Main Topics  . Smoking status: Never Smoker   . Smokeless tobacco: None  . Alcohol Use: No  . Drug Use: No  . Sexual Activity: Not Asked   Other Topics Concern  . None   Social History Narrative     Family History:  The patient's family history includes Alzheimer's disease in her mother; Pancreatic cancer in her father.   ROS:   Please see the history of present illness.    ROS All other systems reviewed and are negative.  PHYSICAL EXAM:   VS:  BP 110/74 mmHg  Pulse 68  Ht 5\' 3"  (1.6 m)  Wt 101 lb 6.4 oz (45.995 kg)  BMI 17.97 kg/m2   GEN: Well nourished, well developed, in no acute distress HEENT: normal Neck: no JVD, carotid bruits, or masses Cardiac: RRR; no rubs, or gallops,no edema.  Intact distal pulses bilaterally. 1/6 diastolic murmur at LLSB Respiratory:  clear to auscultation bilaterally, normal work of breathing GI: soft, nontender, nondistended, + BS MS: no deformity or atrophy Skin: warm and dry, no rash Neuro:  Alert and Oriented x 3, Strength and sensation are intact Psych: euthymic mood, full affect  Wt Readings from Last 3 Encounters:  12/11/15 101 lb 6.4 oz (45.995 kg)  11/02/15 102 lb 6.4 oz (46.448 kg)  07/17/15 102 lb  (46.267 kg)      Studies/Labs Reviewed:   EKG:  EKG is not ordered today.    Recent Labs: 11/02/2015: ALT 10; BUN 17; Creat 0.64; Hemoglobin 13.0; Platelets 245; Potassium 4.3; Sodium 142; TSH 1.09   Lipid Panel No results found for: CHOL, TRIG, HDL, CHOLHDL, VLDL, LDLCALC, LDLDIRECT  Additional studies/ records that were reviewed today include:  none    ASSESSMENT:    1. Moderate aortic regurgitation   2. Benign essential HTN   3. Palpitations   4. MVP (mitral valve prolapse)      PLAN:  In order of problems listed above:  1. Moderate AR - stable.  Repeat echo 07/2016 2. HTN - BP controlled.   3.   Palpitations due to anxiety - heart monitor was normal 4.   MVP with mild MR   Medication Adjustments/Labs and Tests Ordered: Current medicines are reviewed at length with the patient today.  Concerns regarding medicines are outlined above.  Medication changes, Labs and Tests ordered today are listed in the Patient Instructions below.  Patient Instructions  Medication Instructions:  Your physician recommends that you continue on your current medications as directed. Please refer to the Current Medication list given to you today.   Labwork: -None  Testing/Procedures: -None   Follow-Up: Your physician wants you to follow-up in: December.  You will receive a reminder letter in the mail two months in advance. If you don't receive a letter, please call our office to schedule the follow-up appointment.   Any Other Special Instructions Will Be Listed Below (If Applicable).     If you need a refill on your cardiac medications before your next appointment, please call your pharmacy.       Kelly [name], MD  12/13/2015 3:31 PM    [facility] [place], [place], [place]  [address] Phone: [phone]; Fax: [phone]

## 2015-12-11 NOTE — Patient Instructions (Signed)
Medication Instructions:  Your physician recommends that you continue on your current medications as directed. Please refer to the Current Medication list given to you today.   Labwork: -None  Testing/Procedures: -None   Follow-Up: Your physician wants you to follow-up in: December.  You will receive a reminder letter in the mail two months in advance. If you don't receive a letter, please call our office to schedule the follow-up appointment.   Any Other Special Instructions Will Be Listed Below (If Applicable).     If you need a refill on your cardiac medications before your next appointment, please call your pharmacy.

## 2015-12-18 DIAGNOSIS — I351 Nonrheumatic aortic (valve) insufficiency: Secondary | ICD-10-CM | POA: Diagnosis not present

## 2015-12-18 DIAGNOSIS — M8588 Other specified disorders of bone density and structure, other site: Secondary | ICD-10-CM | POA: Diagnosis not present

## 2015-12-18 DIAGNOSIS — Z0000 Encounter for general adult medical examination without abnormal findings: Secondary | ICD-10-CM | POA: Diagnosis not present

## 2015-12-18 DIAGNOSIS — Z85820 Personal history of malignant melanoma of skin: Secondary | ICD-10-CM | POA: Diagnosis not present

## 2015-12-18 DIAGNOSIS — Z1389 Encounter for screening for other disorder: Secondary | ICD-10-CM | POA: Diagnosis not present

## 2015-12-18 DIAGNOSIS — E559 Vitamin D deficiency, unspecified: Secondary | ICD-10-CM | POA: Diagnosis not present

## 2015-12-18 DIAGNOSIS — E7800 Pure hypercholesterolemia, unspecified: Secondary | ICD-10-CM | POA: Diagnosis not present

## 2016-02-17 DIAGNOSIS — H401111 Primary open-angle glaucoma, right eye, mild stage: Secondary | ICD-10-CM | POA: Diagnosis not present

## 2016-02-17 DIAGNOSIS — H35371 Puckering of macula, right eye: Secondary | ICD-10-CM | POA: Diagnosis not present

## 2016-02-25 DIAGNOSIS — M1711 Unilateral primary osteoarthritis, right knee: Secondary | ICD-10-CM | POA: Diagnosis not present

## 2016-02-25 DIAGNOSIS — M25511 Pain in right shoulder: Secondary | ICD-10-CM | POA: Diagnosis not present

## 2016-02-25 DIAGNOSIS — Z96651 Presence of right artificial knee joint: Secondary | ICD-10-CM | POA: Diagnosis not present

## 2016-03-02 DIAGNOSIS — D235 Other benign neoplasm of skin of trunk: Secondary | ICD-10-CM | POA: Diagnosis not present

## 2016-03-02 DIAGNOSIS — L821 Other seborrheic keratosis: Secondary | ICD-10-CM | POA: Diagnosis not present

## 2016-03-02 DIAGNOSIS — L814 Other melanin hyperpigmentation: Secondary | ICD-10-CM | POA: Diagnosis not present

## 2016-03-03 DIAGNOSIS — S2001XA Contusion of right breast, initial encounter: Secondary | ICD-10-CM | POA: Diagnosis not present

## 2016-03-08 DIAGNOSIS — M25511 Pain in right shoulder: Secondary | ICD-10-CM | POA: Diagnosis not present

## 2016-03-15 DIAGNOSIS — S46011D Strain of muscle(s) and tendon(s) of the rotator cuff of right shoulder, subsequent encounter: Secondary | ICD-10-CM | POA: Diagnosis not present

## 2016-03-15 DIAGNOSIS — M25511 Pain in right shoulder: Secondary | ICD-10-CM | POA: Diagnosis not present

## 2016-03-21 DIAGNOSIS — E7800 Pure hypercholesterolemia, unspecified: Secondary | ICD-10-CM | POA: Diagnosis not present

## 2016-03-30 DIAGNOSIS — M7541 Impingement syndrome of right shoulder: Secondary | ICD-10-CM | POA: Diagnosis not present

## 2016-03-30 DIAGNOSIS — G8918 Other acute postprocedural pain: Secondary | ICD-10-CM | POA: Diagnosis not present

## 2016-03-30 DIAGNOSIS — M75121 Complete rotator cuff tear or rupture of right shoulder, not specified as traumatic: Secondary | ICD-10-CM | POA: Diagnosis not present

## 2016-04-28 DIAGNOSIS — Z23 Encounter for immunization: Secondary | ICD-10-CM | POA: Diagnosis not present

## 2016-05-10 DIAGNOSIS — S46011D Strain of muscle(s) and tendon(s) of the rotator cuff of right shoulder, subsequent encounter: Secondary | ICD-10-CM | POA: Diagnosis not present

## 2016-05-11 ENCOUNTER — Telehealth: Payer: Self-pay | Admitting: Cardiology

## 2016-05-11 DIAGNOSIS — S46011D Strain of muscle(s) and tendon(s) of the rotator cuff of right shoulder, subsequent encounter: Secondary | ICD-10-CM | POA: Diagnosis not present

## 2016-05-11 NOTE — Telephone Encounter (Signed)
New message      Pt is coming to see Dr Radford Pax 07-19-16.  There was an echo in recall for dec 2017.  I scheduled the echo  same day as ov on 07-19-16.  However, there was no order for the echo.  Please put in order for 07-19-16 echo.  If Dr Radford Pax does not want pt to have echo, cancel echo appt ---pt will still come for appt.

## 2016-05-11 NOTE — Telephone Encounter (Signed)
Already ordered ECHO linked to appointment time.

## 2016-05-13 DIAGNOSIS — S46011D Strain of muscle(s) and tendon(s) of the rotator cuff of right shoulder, subsequent encounter: Secondary | ICD-10-CM | POA: Diagnosis not present

## 2016-05-16 DIAGNOSIS — S46011D Strain of muscle(s) and tendon(s) of the rotator cuff of right shoulder, subsequent encounter: Secondary | ICD-10-CM | POA: Diagnosis not present

## 2016-05-18 DIAGNOSIS — S46011D Strain of muscle(s) and tendon(s) of the rotator cuff of right shoulder, subsequent encounter: Secondary | ICD-10-CM | POA: Diagnosis not present

## 2016-05-19 DIAGNOSIS — M1712 Unilateral primary osteoarthritis, left knee: Secondary | ICD-10-CM | POA: Diagnosis not present

## 2016-05-20 DIAGNOSIS — S46011D Strain of muscle(s) and tendon(s) of the rotator cuff of right shoulder, subsequent encounter: Secondary | ICD-10-CM | POA: Diagnosis not present

## 2016-05-23 DIAGNOSIS — S46011D Strain of muscle(s) and tendon(s) of the rotator cuff of right shoulder, subsequent encounter: Secondary | ICD-10-CM | POA: Diagnosis not present

## 2016-05-25 DIAGNOSIS — S46011D Strain of muscle(s) and tendon(s) of the rotator cuff of right shoulder, subsequent encounter: Secondary | ICD-10-CM | POA: Diagnosis not present

## 2016-05-27 DIAGNOSIS — S46011D Strain of muscle(s) and tendon(s) of the rotator cuff of right shoulder, subsequent encounter: Secondary | ICD-10-CM | POA: Diagnosis not present

## 2016-05-30 DIAGNOSIS — S46011D Strain of muscle(s) and tendon(s) of the rotator cuff of right shoulder, subsequent encounter: Secondary | ICD-10-CM | POA: Diagnosis not present

## 2016-06-01 DIAGNOSIS — S46011D Strain of muscle(s) and tendon(s) of the rotator cuff of right shoulder, subsequent encounter: Secondary | ICD-10-CM | POA: Diagnosis not present

## 2016-06-03 DIAGNOSIS — S46011D Strain of muscle(s) and tendon(s) of the rotator cuff of right shoulder, subsequent encounter: Secondary | ICD-10-CM | POA: Diagnosis not present

## 2016-06-06 DIAGNOSIS — S46011D Strain of muscle(s) and tendon(s) of the rotator cuff of right shoulder, subsequent encounter: Secondary | ICD-10-CM | POA: Diagnosis not present

## 2016-06-08 DIAGNOSIS — S46011D Strain of muscle(s) and tendon(s) of the rotator cuff of right shoulder, subsequent encounter: Secondary | ICD-10-CM | POA: Diagnosis not present

## 2016-06-10 DIAGNOSIS — S46011D Strain of muscle(s) and tendon(s) of the rotator cuff of right shoulder, subsequent encounter: Secondary | ICD-10-CM | POA: Diagnosis not present

## 2016-06-13 DIAGNOSIS — S46011D Strain of muscle(s) and tendon(s) of the rotator cuff of right shoulder, subsequent encounter: Secondary | ICD-10-CM | POA: Diagnosis not present

## 2016-06-15 DIAGNOSIS — S46011D Strain of muscle(s) and tendon(s) of the rotator cuff of right shoulder, subsequent encounter: Secondary | ICD-10-CM | POA: Diagnosis not present

## 2016-06-17 DIAGNOSIS — S46011D Strain of muscle(s) and tendon(s) of the rotator cuff of right shoulder, subsequent encounter: Secondary | ICD-10-CM | POA: Diagnosis not present

## 2016-06-20 DIAGNOSIS — S46011D Strain of muscle(s) and tendon(s) of the rotator cuff of right shoulder, subsequent encounter: Secondary | ICD-10-CM | POA: Diagnosis not present

## 2016-06-22 DIAGNOSIS — S46011D Strain of muscle(s) and tendon(s) of the rotator cuff of right shoulder, subsequent encounter: Secondary | ICD-10-CM | POA: Diagnosis not present

## 2016-06-24 DIAGNOSIS — S46011D Strain of muscle(s) and tendon(s) of the rotator cuff of right shoulder, subsequent encounter: Secondary | ICD-10-CM | POA: Diagnosis not present

## 2016-06-27 DIAGNOSIS — S46011D Strain of muscle(s) and tendon(s) of the rotator cuff of right shoulder, subsequent encounter: Secondary | ICD-10-CM | POA: Diagnosis not present

## 2016-06-29 DIAGNOSIS — S46011D Strain of muscle(s) and tendon(s) of the rotator cuff of right shoulder, subsequent encounter: Secondary | ICD-10-CM | POA: Diagnosis not present

## 2016-07-04 DIAGNOSIS — S46011D Strain of muscle(s) and tendon(s) of the rotator cuff of right shoulder, subsequent encounter: Secondary | ICD-10-CM | POA: Diagnosis not present

## 2016-07-06 DIAGNOSIS — S46011D Strain of muscle(s) and tendon(s) of the rotator cuff of right shoulder, subsequent encounter: Secondary | ICD-10-CM | POA: Diagnosis not present

## 2016-07-07 DIAGNOSIS — S46011S Strain of muscle(s) and tendon(s) of the rotator cuff of right shoulder, sequela: Secondary | ICD-10-CM | POA: Diagnosis not present

## 2016-07-07 DIAGNOSIS — Z4789 Encounter for other orthopedic aftercare: Secondary | ICD-10-CM | POA: Diagnosis not present

## 2016-07-08 DIAGNOSIS — S46011D Strain of muscle(s) and tendon(s) of the rotator cuff of right shoulder, subsequent encounter: Secondary | ICD-10-CM | POA: Diagnosis not present

## 2016-07-11 DIAGNOSIS — S46011D Strain of muscle(s) and tendon(s) of the rotator cuff of right shoulder, subsequent encounter: Secondary | ICD-10-CM | POA: Diagnosis not present

## 2016-07-18 DIAGNOSIS — S46011D Strain of muscle(s) and tendon(s) of the rotator cuff of right shoulder, subsequent encounter: Secondary | ICD-10-CM | POA: Diagnosis not present

## 2016-07-19 ENCOUNTER — Other Ambulatory Visit: Payer: Self-pay

## 2016-07-19 ENCOUNTER — Ambulatory Visit (HOSPITAL_COMMUNITY): Payer: Medicare Other | Attending: Cardiology

## 2016-07-19 ENCOUNTER — Encounter (INDEPENDENT_AMBULATORY_CARE_PROVIDER_SITE_OTHER): Payer: Self-pay

## 2016-07-19 ENCOUNTER — Ambulatory Visit (INDEPENDENT_AMBULATORY_CARE_PROVIDER_SITE_OTHER): Payer: Medicare Other | Admitting: Cardiology

## 2016-07-19 VITALS — BP 122/68 | HR 80 | Ht 63.0 in | Wt 101.8 lb

## 2016-07-19 DIAGNOSIS — I10 Essential (primary) hypertension: Secondary | ICD-10-CM | POA: Diagnosis not present

## 2016-07-19 DIAGNOSIS — I351 Nonrheumatic aortic (valve) insufficiency: Secondary | ICD-10-CM

## 2016-07-19 DIAGNOSIS — I341 Nonrheumatic mitral (valve) prolapse: Secondary | ICD-10-CM

## 2016-07-19 DIAGNOSIS — I253 Aneurysm of heart: Secondary | ICD-10-CM | POA: Diagnosis not present

## 2016-07-19 DIAGNOSIS — I359 Nonrheumatic aortic valve disorder, unspecified: Secondary | ICD-10-CM | POA: Insufficient documentation

## 2016-07-19 LAB — ECHOCARDIOGRAM COMPLETE
Height: 63 in
WEIGHTICAEL: 1628.8 [oz_av]

## 2016-07-19 NOTE — Patient Instructions (Signed)

## 2016-07-19 NOTE — Progress Notes (Signed)
Cardiology Office Note    Date:  07/19/2016   ID:  [name], DOB Nov 19, 1940, MRN [id number]  PCP:  Kelly Heck, MD  Cardiologist:  Fransico Him, MD   Chief Complaint  Patient presents with  . Follow-up    aortic insufficiency    History of Present Illness:  Kelly Grimes is a 75 y.o. female  with a history of moderate AR presents today for followup. She denies any chest pain or DOE. She denies any LE edema, palpitations or syncope.Occasionally she will get lightheaded if she gets up too fast from a chair.  She walks a few miles every am and then another mile in the afternoon.    Past Medical History:  Diagnosis Date  . Arthritis   . Bruises easily   . Glaucoma    Pre glaucoma Dr. Ellie Grimes / "borderline"  . Headache    Dr. Krista Grimes  . Hyperlipidemia   . Melanoma ([place]) 1993   Left arm Dr Kelly Grimes  . Moderate aortic insufficiency 12/11  . MVP (mitral valve prolapse) 12/11/2015  . Osteopenia     Past Surgical History:  Procedure Laterality Date  . EYE SURGERY     od cataract  . HERNIA REPAIR     right inguinal/femoral  . JOINT REPLACEMENT     hip  . OPEN REDUCTION INTERNAL FIXATION (ORIF) DISTAL RADIAL FRACTURE Right 11/16/2013   Procedure: OPEN REDUCTION INTERNAL FIXATION (ORIF) RIGHT DISTAL RADIUS FRACTURE WITH REPAIR AS NEEDED AND ALLOGRAFT BONE GRAFT;  Surgeon: Kelly Kaufman, MD;  Location: [facility];  Service: Orthopedics;  Laterality: Right;  . TOTAL KNEE ARTHROPLASTY Right 03/24/2015   Procedure: RIGHT TOTAL KNEE ARTHROPLASTY;  Surgeon: Kelly Maudlin, MD;  Location: [facility];  Service: Orthopedics;  Laterality: Right;    Current Medications: Outpatient Medications Prior to Visit  Medication Sig Dispense Refill  . brinzolamide (AZOPT) 1 % ophthalmic suspension Place 1 drop into both eyes 2 (two) times daily.    . clobetasol ointment (TEMOVATE) [id number] % Apply 1 application topically daily as needed (skin).   2  . Travoprost, BAK Free, (TRAVATAN)  0.004 % SOLN ophthalmic solution Place 1 drop into both eyes at bedtime.     Facility-Administered Medications Prior to Visit  Medication Dose Route Frequency Provider Last Rate Last Dose  . bupivacaine liposome (EXPAREL) 1.3 % injection 266 mg  20 mL Infiltration Once [employer], PA-C         Allergies:   Patient has no known allergies.   Social History   Social History  . Marital status: Married    Spouse name: N/A  . Number of children: N/A  . Years of education: N/A   Social History Main Topics  . Smoking status: Never Smoker  . Smokeless tobacco: Not on file  . Alcohol use No  . Drug use: No  . Sexual activity: Not on file   Other Topics Concern  . Not on file   Social History Narrative  . No narrative on file     Family History:  The patient's family history includes Alzheimer's disease in her mother; Pancreatic cancer in her father.   ROS:   Please see the history of present illness.    ROS All other systems reviewed and are negative.  No flowsheet data found.     PHYSICAL EXAM:   VS:  BP 122/68   Pulse 80   Ht 5\' 3"  (1.6 m)   Wt 101 lb 12.8 oz (46.2 kg)  SpO2 99%   BMI 18.03 kg/m    GEN: Well nourished, well developed, in no acute distress  HEENT: normal  Neck: no JVD, carotid bruits, or masses Cardiac: RRR; no murmurs, rubs, or gallops,no edema.  Intact distal pulses bilaterally.  Respiratory:  clear to auscultation bilaterally, normal work of breathing GI: soft, nontender, nondistended, + BS MS: no deformity or atrophy  Skin: warm and dry, no rash Neuro:  Alert and Oriented x 3, Strength and sensation are intact Psych: euthymic mood, full affect  Wt Readings from Last 3 Encounters:  07/19/16 101 lb 12.8 oz (46.2 kg)  12/11/15 101 lb 6.4 oz (46 kg)  11/02/15 102 lb 6.4 oz (46.4 kg)      Studies/Labs Reviewed:   EKG:  EKG is not ordered today.  T  Recent Labs: 11/02/2015: ALT 10; BUN 17; Creat 0.64; Hemoglobin 13.0; Platelets 245;  Potassium 4.3; Sodium 142; TSH 1.09   Lipid Panel No results found for: CHOL, TRIG, HDL, CHOLHDL, VLDL, LDLCALC, LDLDIRECT  Additional studies/ records that were reviewed today include:  none    ASSESSMENT:    1. Moderate aortic regurgitation   2. Benign essential HTN   3. MVP (mitral valve prolapse)      PLAN:  In order of problems listed above:  1. Moderate AR - repeat echo today to make sure that AR is stable. 2. HTN - BP controlled.  3. MVP by echo- repeat    Medication Adjustments/Labs and Tests Ordered: Current medicines are reviewed at length with the patient today.  Concerns regarding medicines are outlined above.  Medication changes, Labs and Tests ordered today are listed in the Patient Instructions below.  There are no Patient Instructions on file for this visit.   Signed, Fransico Him, MD  07/19/2016 8:16 AM    [facility] [address], [place], [place]  [address] Phone: [phone]; Fax: [phone]

## 2016-07-25 DIAGNOSIS — S46011D Strain of muscle(s) and tendon(s) of the rotator cuff of right shoulder, subsequent encounter: Secondary | ICD-10-CM | POA: Diagnosis not present

## 2016-08-04 DIAGNOSIS — Z4789 Encounter for other orthopedic aftercare: Secondary | ICD-10-CM | POA: Diagnosis not present

## 2016-08-04 DIAGNOSIS — S46011S Strain of muscle(s) and tendon(s) of the rotator cuff of right shoulder, sequela: Secondary | ICD-10-CM | POA: Diagnosis not present

## 2016-08-30 ENCOUNTER — Other Ambulatory Visit: Payer: Self-pay | Admitting: Family Medicine

## 2016-08-30 DIAGNOSIS — Z1231 Encounter for screening mammogram for malignant neoplasm of breast: Secondary | ICD-10-CM

## 2016-08-31 ENCOUNTER — Ambulatory Visit: Payer: Medicare Other

## 2016-09-02 DIAGNOSIS — H401131 Primary open-angle glaucoma, bilateral, mild stage: Secondary | ICD-10-CM | POA: Diagnosis not present

## 2016-09-05 ENCOUNTER — Ambulatory Visit
Admission: RE | Admit: 2016-09-05 | Discharge: 2016-09-05 | Disposition: A | Discharge status: Home / Self Care | Payer: Medicare Other | Source: Ambulatory Visit | Attending: Family Medicine | Admitting: Family Medicine

## 2016-09-05 DIAGNOSIS — Z1231 Encounter for screening mammogram for malignant neoplasm of breast: Secondary | ICD-10-CM

## 2016-09-05 IMAGING — MG 2D DIGITAL SCREENING BILATERAL MAMMOGRAM WITH CAD AND ADJUNCT TO
9 of 12 series · 9 of 28 positions shown · non-contrast
Comparison: Previous exam(s).

CLINICAL DATA: Screening.

EXAM:
2D DIGITAL SCREENING BILATERAL MAMMOGRAM WITH CAD AND ADJUNCT TOMO

[L MLO synth-2D]
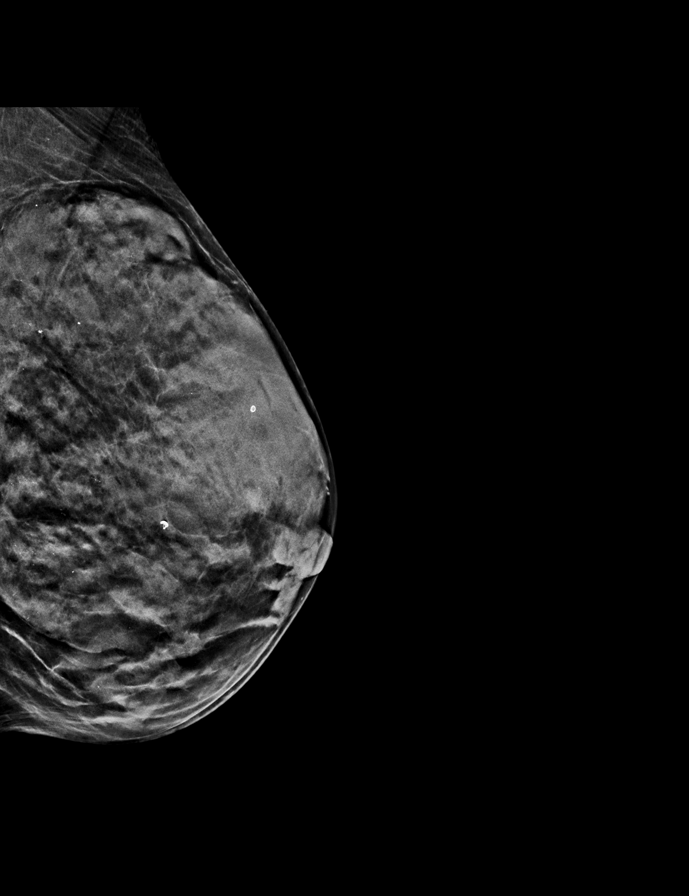

[L CC]
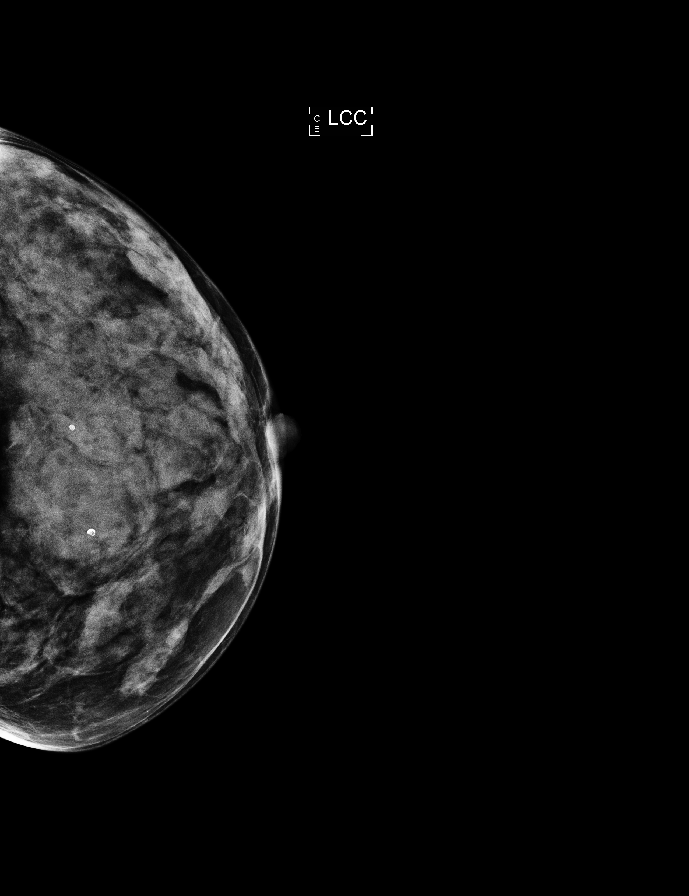

[L MLO]
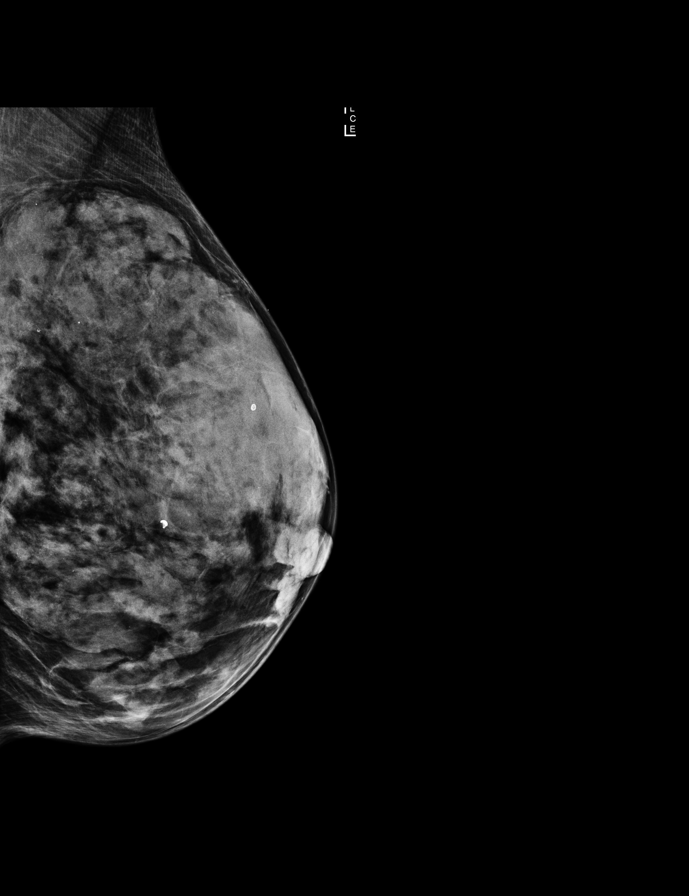

[R CC synth-2D]
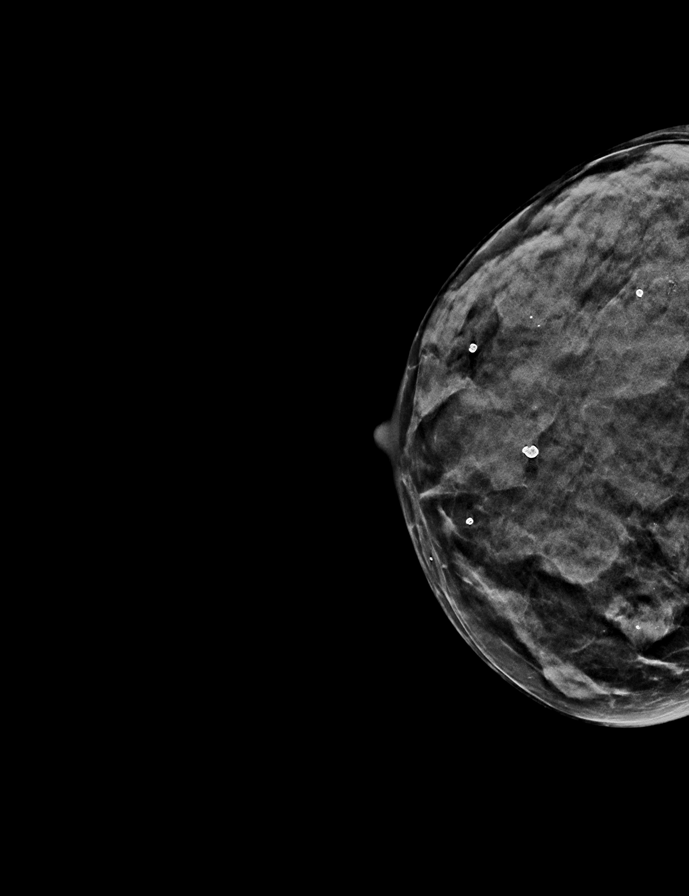

[R CC]
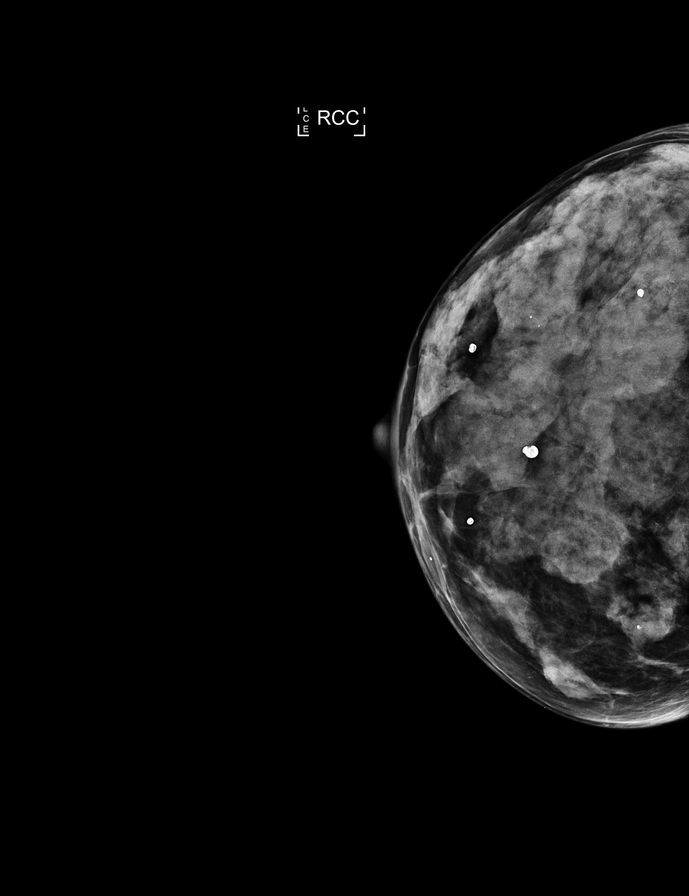

[R MLO synth-2D]
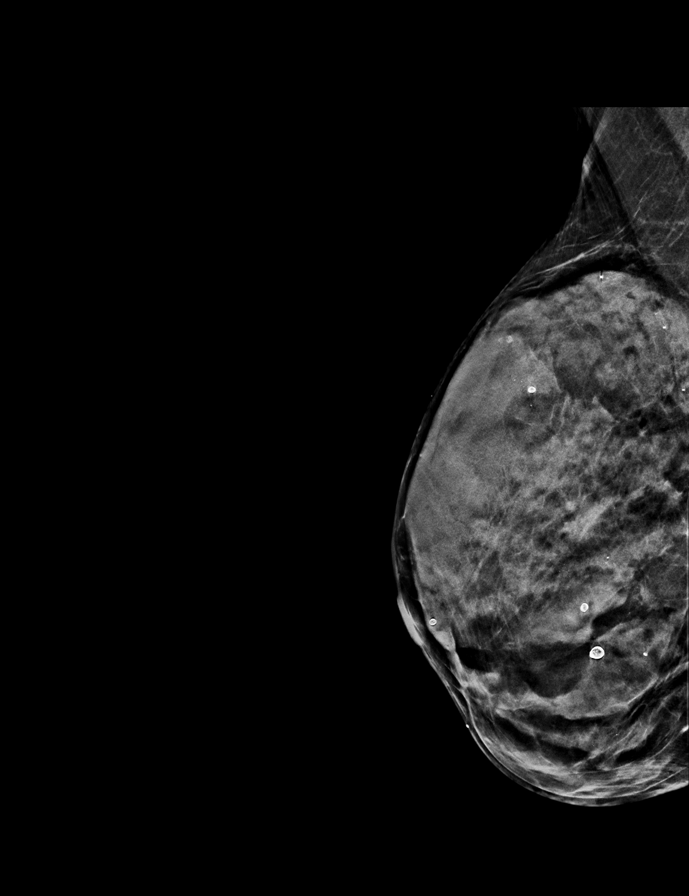

[L CC synth-2D]
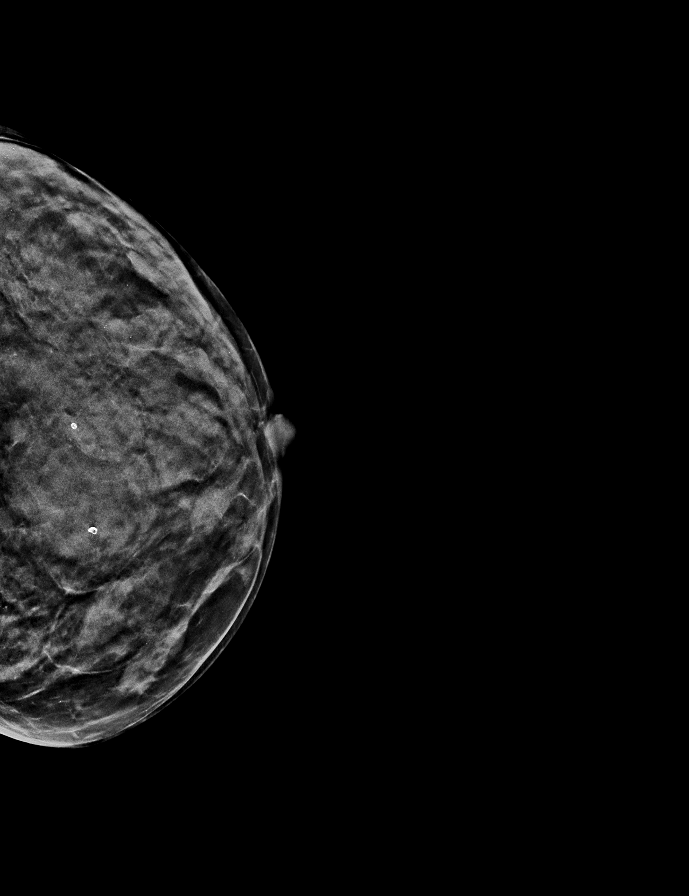

[R MLO]
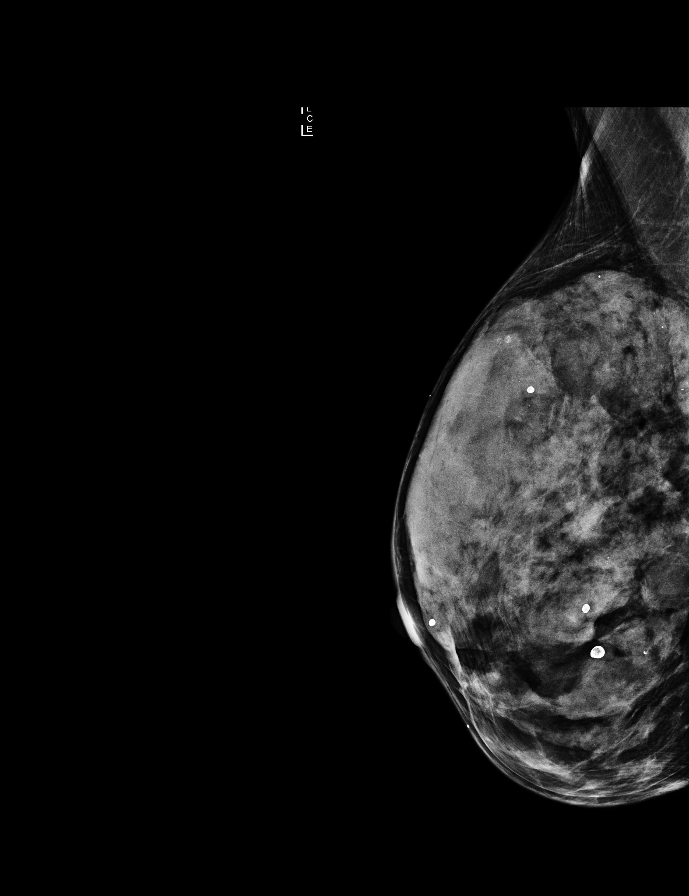

[R CC tomo · tomo slice 19/36.0]
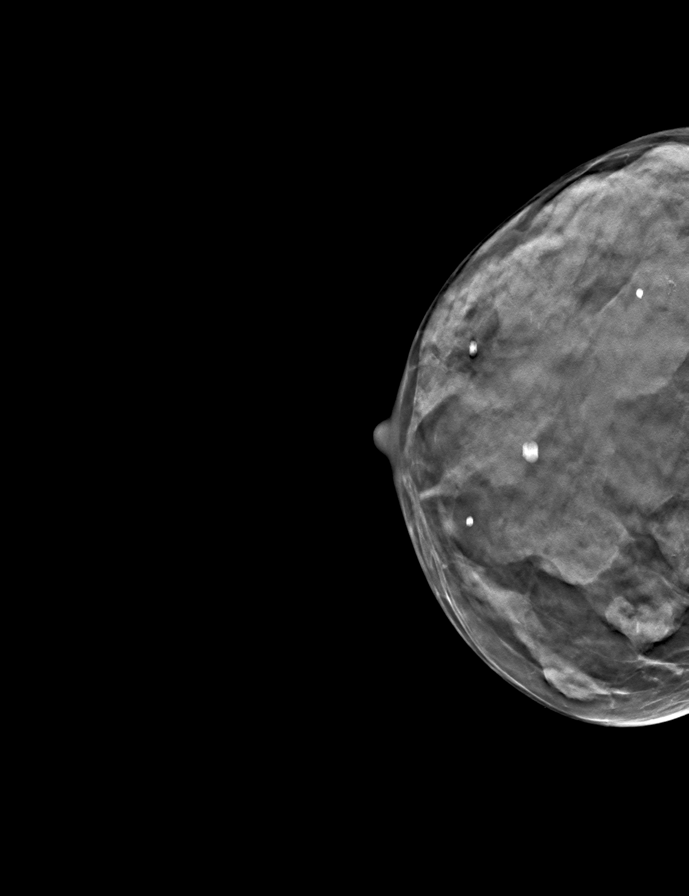

[9 of 28 positions shown; findings below may reference images not displayed]

ACR Breast Density Category d: The breast tissue is extremely dense,
which lowers the sensitivity of mammography.
FINDINGS: There are no findings suspicious for malignancy. Images were
processed with CAD.
IMPRESSION: No mammographic evidence of malignancy. A result letter of this
screening mammogram will be mailed directly to the patient.

RECOMMENDATION:
Screening mammogram in one year. (Code:[5L])

BI-RADS CATEGORY  1: Negative.

## 2016-11-01 DIAGNOSIS — Z01419 Encounter for gynecological examination (general) (routine) without abnormal findings: Secondary | ICD-10-CM | POA: Diagnosis not present

## 2016-11-01 DIAGNOSIS — Z681 Body mass index (BMI) 19 or less, adult: Secondary | ICD-10-CM | POA: Diagnosis not present

## 2016-12-26 DIAGNOSIS — Z0000 Encounter for general adult medical examination without abnormal findings: Secondary | ICD-10-CM | POA: Diagnosis not present

## 2016-12-26 DIAGNOSIS — E559 Vitamin D deficiency, unspecified: Secondary | ICD-10-CM | POA: Diagnosis not present

## 2016-12-26 DIAGNOSIS — Z1389 Encounter for screening for other disorder: Secondary | ICD-10-CM | POA: Diagnosis not present

## 2016-12-26 DIAGNOSIS — E7800 Pure hypercholesterolemia, unspecified: Secondary | ICD-10-CM | POA: Diagnosis not present

## 2017-02-20 DIAGNOSIS — Z961 Presence of intraocular lens: Secondary | ICD-10-CM | POA: Diagnosis not present

## 2017-02-20 DIAGNOSIS — H401131 Primary open-angle glaucoma, bilateral, mild stage: Secondary | ICD-10-CM | POA: Diagnosis not present

## 2017-02-22 DIAGNOSIS — H401111 Primary open-angle glaucoma, right eye, mild stage: Secondary | ICD-10-CM | POA: Diagnosis not present

## 2017-03-01 DIAGNOSIS — L821 Other seborrheic keratosis: Secondary | ICD-10-CM | POA: Diagnosis not present

## 2017-03-01 DIAGNOSIS — L820 Inflamed seborrheic keratosis: Secondary | ICD-10-CM | POA: Diagnosis not present

## 2017-03-01 DIAGNOSIS — L814 Other melanin hyperpigmentation: Secondary | ICD-10-CM | POA: Diagnosis not present

## 2017-03-01 DIAGNOSIS — D225 Melanocytic nevi of trunk: Secondary | ICD-10-CM | POA: Diagnosis not present

## 2017-04-03 ENCOUNTER — Telehealth: Payer: Self-pay | Admitting: Cardiology

## 2017-04-03 DIAGNOSIS — I351 Nonrheumatic aortic (valve) insufficiency: Secondary | ICD-10-CM

## 2017-04-03 NOTE — Telephone Encounter (Signed)
Order entered and  message sent to [facility] to call pt and schedule ./cy

## 2017-04-03 NOTE — Telephone Encounter (Signed)
NEw Message  Pt call requesting to speak with RN about getting an Echo completed for December. Please call back to discuss

## 2017-07-21 ENCOUNTER — Ambulatory Visit (HOSPITAL_COMMUNITY): Payer: Medicare Other | Attending: Cardiology

## 2017-07-21 ENCOUNTER — Ambulatory Visit: Payer: Medicare Other | Admitting: Cardiology

## 2017-07-21 ENCOUNTER — Encounter: Payer: Self-pay | Admitting: Cardiology

## 2017-07-21 ENCOUNTER — Other Ambulatory Visit: Payer: Self-pay

## 2017-07-21 VITALS — BP 110/56 | HR 60 | Ht 63.0 in | Wt 98.6 lb

## 2017-07-21 DIAGNOSIS — R42 Dizziness and giddiness: Secondary | ICD-10-CM | POA: Diagnosis not present

## 2017-07-21 DIAGNOSIS — I351 Nonrheumatic aortic (valve) insufficiency: Secondary | ICD-10-CM

## 2017-07-21 DIAGNOSIS — I10 Essential (primary) hypertension: Secondary | ICD-10-CM | POA: Diagnosis not present

## 2017-07-21 DIAGNOSIS — I341 Nonrheumatic mitral (valve) prolapse: Secondary | ICD-10-CM | POA: Diagnosis not present

## 2017-07-21 NOTE — Progress Notes (Signed)
Cardiology Office Note:    Date:  07/21/2017   ID:  MANUEL LAWHEAD, DOB [birth year], MRN [id number]  PCP:  Leighton Ruff, MD  Cardiologist:  Fransico Him, MD   Referring MD: Leighton Ruff, MD   Chief Complaint  Patient presents with  . Follow-up    moderate AR    History of Present Illness:    Kelly Grimes is a 76 y.o. female with a hx of moderate AR.  Her last echo was 07/21/2017 showing normal LVF with EF 60-65% with mild LVH and moderate AR by report but PHT was in the mild range ad 539msec.  She is here today for followup and is doing well.  She denies any chest pain or pressure, SOB, DOE, PND, orthopnea, LE edema,, palpitations or syncope. She occasionally feels lightheaded when walking and then breaks out in a cold sweat. Usually her walk is before she has eaten breakfast.   She is compliant with her meds and is tolerating meds with no SE.     Past Medical History:  Diagnosis Date  . Arthritis   . Bruises easily   . Glaucoma    Pre glaucoma Dr. Ellie Lunch / "borderline"  . Headache    Dr. Krista Blue  . Hyperlipidemia   . Melanoma ([place]) 1993   Left arm Dr Allyson Sabal  . Moderate aortic insufficiency 12/11  . MVP (mitral valve prolapse) 12/11/2015  . Osteopenia     Past Surgical History:  Procedure Laterality Date  . EYE SURGERY     od cataract  . HERNIA REPAIR     right inguinal/femoral  . JOINT REPLACEMENT     hip  . OPEN REDUCTION INTERNAL FIXATION (ORIF) DISTAL RADIAL FRACTURE Right 11/16/2013   Procedure: OPEN REDUCTION INTERNAL FIXATION (ORIF) RIGHT DISTAL RADIUS FRACTURE WITH REPAIR AS NEEDED AND ALLOGRAFT BONE GRAFT;  Surgeon: Roseanne Kaufman, MD;  Location: [facility];  Service: Orthopedics;  Laterality: Right;  . TOTAL KNEE ARTHROPLASTY Right 03/24/2015   Procedure: RIGHT TOTAL KNEE ARTHROPLASTY;  Surgeon: Latanya Maudlin, MD;  [facility];  Service: Orthopedics;  Laterality: Right;    Current Medications: Current Meds  Medication Sig  . brinzolamide  (AZOPT) 1 % ophthalmic suspension Place 1 drop into both eyes 2 (two) times daily.  . clobetasol ointment (TEMOVATE) 9.38 % Apply 1 application topically daily as needed (skin).   . Travoprost, BAK Free, (TRAVATAN) 0.004 % SOLN ophthalmic solution Place 1 drop into both eyes at bedtime.     Allergies:   Patient has no known allergies.   Social History   Socioeconomic History  . Marital status: Married    Spouse name: None  . Number of children: None  . Years of education: None  . Highest education level: None  Social Needs  . Financial resource strain: None  . Food insecurity - worry: None  . Food insecurity - inability: None  . Transportation needs - medical: None  . Transportation needs - non-medical: None  Occupational History  . None  Tobacco Use  . Smoking status: Never Smoker  . Smokeless tobacco: Never Used  Substance and Sexual Activity  . Alcohol use: No  . Drug use: No  . Sexual activity: None  Other Topics Concern  . None  Social History Narrative  . None     Family History: The patient's family history includes Alzheimer's disease in her mother; Pancreatic cancer in her father.  ROS:   Please see the history of present illness.  ROS  All other systems reviewed and negative.   EKGs/Labs/Other Studies Reviewed:    The following studies were reviewed today: none  EKG:  EKG is ordered today and showed NSR at 60bpm with LAE and LVH  Recent Labs: No results found for requested labs within last 8760 hours.   Recent Lipid Panel No results found for: CHOL, TRIG, HDL, CHOLHDL, VLDL, LDLCALC, LDLDIRECT  Physical Exam:    VS:  BP (!) 110/56   Pulse 60   Ht 5\' 3"  (1.6 m)   Wt 98 lb 9.6 oz (44.7 kg)   BMI 17.47 kg/m     Wt Readings from Last 3 Encounters:  07/21/17 98 lb 9.6 oz (44.7 kg)  07/19/16 101 lb 12.8 oz (46.2 kg)  12/11/15 101 lb 6.4 oz (46 kg)     GEN:  Well nourished, well developed in no acute distress HEENT: Normal NECK: No JVD;  No carotid bruits LYMPHATICS: No lymphadenopathy CARDIAC: RRR, no murmurs, rubs, gallops RESPIRATORY:  Clear to auscultation without rales, wheezing or rhonchi  ABDOMEN: Soft, non-tender, non-distended MUSCULOSKELETAL:  No edema; No deformity  SKIN: Warm and dry NEUROLOGIC:  Alert and oriented x 3 PSYCHIATRIC:  Normal affect   ASSESSMENT:    1. Moderate aortic regurgitation   2. Benign essential HTN   3. MVP (mitral valve prolapse)    PLAN:    In order of problems listed above:  1.  Mild to moderate AR - recent echo read out as moderate but PHT was 525msec which is in the mild range.  LV size and function are normal. Repeat echo in 1 year.   2.  HTN - Bp is well controlled on diet alone.  3.  MVP - no evidence on recent echo.  4.  Lightheadedness - ? Etiology.  I wonder if she is having hypoglycemia as she does not eat before she walks int he am.  Also needs to consider atypical angina. Her EKG is normal so I will get an ETT to rule out ischemia.    Medication Adjustments/Labs and Tests Ordered: Current medicines are reviewed at length with the patient today.  Concerns regarding medicines are outlined above.  No orders of the defined types were placed in this encounter.  No orders of the defined types were placed in this encounter.   Signed, Fransico Him, MD  07/21/2017 9:41 AM    [place]

## 2017-07-21 NOTE — Addendum Note (Signed)
Addended by: Drue Novel I on: 07/21/2017 10:56 AM   Modules accepted: Orders

## 2017-07-21 NOTE — Patient Instructions (Signed)
Medication Instructions:  Your physician recommends that you continue on your current medications as directed. Please refer to the Current Medication list given to you today.   Labwork: None ordered  Testing/Procedures: Your physician has requested that you have an exercise tolerance test approximately 1 week or next available. For further information please visit [email or web address]. Please also follow instruction sheet, as given.  Your physician has requested that you have an echocardiogram in 1 year. Echocardiography is a painless test that uses sound waves to create images of your heart. It provides your doctor with information about the size and shape of your heart and how well your heart's chambers and valves are working. This procedure takes approximately one hour. There are no restrictions for this procedure.    Follow-Up: Your physician wants you to follow-up in: 1 year with Dr. Radford Pax. You will receive a reminder letter in the mail two months in advance. If you don't receive a letter, please call our office to schedule the follow-up appointment.   Any Other Special Instructions Will Be Listed Below (If Applicable).     If you need a refill on your cardiac medications before your next appointment, please call your pharmacy.

## 2017-07-25 ENCOUNTER — Ambulatory Visit (INDEPENDENT_AMBULATORY_CARE_PROVIDER_SITE_OTHER): Payer: Medicare Other

## 2017-07-25 DIAGNOSIS — R42 Dizziness and giddiness: Secondary | ICD-10-CM

## 2017-07-25 LAB — EXERCISE TOLERANCE TEST
CHL CUP RESTING HR STRESS: 72 {beats}/min
CHL RATE OF PERCEIVED EXERTION: 15
CSEPEDS: 30 s
CSEPEW: 7.7 METS
Exercise duration (min): 6 min
MPHR: 145 {beats}/min
Peak HR: 144 {beats}/min
Percent HR: 99 %

## 2017-07-28 ENCOUNTER — Other Ambulatory Visit: Payer: Self-pay | Admitting: Obstetrics and Gynecology

## 2017-07-28 DIAGNOSIS — Z139 Encounter for screening, unspecified: Secondary | ICD-10-CM

## 2017-08-28 DIAGNOSIS — Z90710 Acquired absence of both cervix and uterus: Secondary | ICD-10-CM | POA: Diagnosis not present

## 2017-08-28 DIAGNOSIS — I10 Essential (primary) hypertension: Secondary | ICD-10-CM | POA: Diagnosis not present

## 2017-08-28 DIAGNOSIS — Z79890 Hormone replacement therapy (postmenopausal): Secondary | ICD-10-CM | POA: Diagnosis not present

## 2017-08-28 DIAGNOSIS — H401131 Primary open-angle glaucoma, bilateral, mild stage: Secondary | ICD-10-CM | POA: Diagnosis not present

## 2017-09-06 ENCOUNTER — Ambulatory Visit
Admission: RE | Admit: 2017-09-06 | Discharge: 2017-09-06 | Disposition: A | Discharge status: Home / Self Care | Payer: Medicare Other | Source: Ambulatory Visit | Attending: Obstetrics and Gynecology | Admitting: Obstetrics and Gynecology

## 2017-09-06 DIAGNOSIS — Z1231 Encounter for screening mammogram for malignant neoplasm of breast: Secondary | ICD-10-CM | POA: Diagnosis not present

## 2017-09-06 DIAGNOSIS — Z139 Encounter for screening, unspecified: Secondary | ICD-10-CM

## 2017-11-23 DIAGNOSIS — Z681 Body mass index (BMI) 19 or less, adult: Secondary | ICD-10-CM | POA: Diagnosis not present

## 2017-11-23 DIAGNOSIS — M8588 Other specified disorders of bone density and structure, other site: Secondary | ICD-10-CM | POA: Diagnosis not present

## 2017-11-23 DIAGNOSIS — N958 Other specified menopausal and perimenopausal disorders: Secondary | ICD-10-CM | POA: Diagnosis not present

## 2017-11-23 DIAGNOSIS — Z01419 Encounter for gynecological examination (general) (routine) without abnormal findings: Secondary | ICD-10-CM | POA: Diagnosis not present

## 2018-01-04 DIAGNOSIS — E7800 Pure hypercholesterolemia, unspecified: Secondary | ICD-10-CM | POA: Diagnosis not present

## 2018-01-04 DIAGNOSIS — Z0000 Encounter for general adult medical examination without abnormal findings: Secondary | ICD-10-CM | POA: Diagnosis not present

## 2018-01-04 DIAGNOSIS — Z1389 Encounter for screening for other disorder: Secondary | ICD-10-CM | POA: Diagnosis not present

## 2018-01-04 DIAGNOSIS — E559 Vitamin D deficiency, unspecified: Secondary | ICD-10-CM | POA: Diagnosis not present

## 2018-01-10 ENCOUNTER — Other Ambulatory Visit: Payer: Self-pay | Admitting: Family Medicine

## 2018-01-10 DIAGNOSIS — R1084 Generalized abdominal pain: Secondary | ICD-10-CM

## 2018-01-18 ENCOUNTER — Ambulatory Visit
Admission: RE | Admit: 2018-01-18 | Discharge: 2018-01-18 | Disposition: A | Discharge status: Home / Self Care | Payer: Medicare Other | Source: Ambulatory Visit | Attending: Family Medicine | Admitting: Family Medicine

## 2018-01-18 DIAGNOSIS — R1084 Generalized abdominal pain: Secondary | ICD-10-CM

## 2018-01-18 DIAGNOSIS — R109 Unspecified abdominal pain: Secondary | ICD-10-CM | POA: Diagnosis not present

## 2018-03-01 DIAGNOSIS — L820 Inflamed seborrheic keratosis: Secondary | ICD-10-CM | POA: Diagnosis not present

## 2018-03-01 DIAGNOSIS — L821 Other seborrheic keratosis: Secondary | ICD-10-CM | POA: Diagnosis not present

## 2018-03-01 DIAGNOSIS — D225 Melanocytic nevi of trunk: Secondary | ICD-10-CM | POA: Diagnosis not present

## 2018-03-01 DIAGNOSIS — L814 Other melanin hyperpigmentation: Secondary | ICD-10-CM | POA: Diagnosis not present

## 2018-03-05 DIAGNOSIS — H5213 Myopia, bilateral: Secondary | ICD-10-CM | POA: Diagnosis not present

## 2018-04-09 ENCOUNTER — Emergency Department (HOSPITAL_COMMUNITY)
Admission: EM | Admit: 2018-04-09 | Discharge: 2018-04-09 | Discharge status: Absent without leave | Payer: Medicare Other

## 2018-04-09 ENCOUNTER — Emergency Department (HOSPITAL_COMMUNITY)
Admission: EM | Admit: 2018-04-09 | Discharge: 2018-04-09 | Disposition: A | Discharge status: Home / Self Care | Payer: Medicare Other | Attending: Emergency Medicine | Admitting: Emergency Medicine

## 2018-04-09 DIAGNOSIS — Z96651 Presence of right artificial knee joint: Secondary | ICD-10-CM | POA: Insufficient documentation

## 2018-04-09 DIAGNOSIS — Z96649 Presence of unspecified artificial hip joint: Secondary | ICD-10-CM | POA: Insufficient documentation

## 2018-04-09 DIAGNOSIS — Z85828 Personal history of other malignant neoplasm of skin: Secondary | ICD-10-CM | POA: Diagnosis not present

## 2018-04-09 DIAGNOSIS — R0989 Other specified symptoms and signs involving the circulatory and respiratory systems: Secondary | ICD-10-CM | POA: Diagnosis not present

## 2018-04-09 DIAGNOSIS — I10 Essential (primary) hypertension: Secondary | ICD-10-CM | POA: Diagnosis not present

## 2018-04-09 DIAGNOSIS — T17308A Unspecified foreign body in larynx causing other injury, initial encounter: Secondary | ICD-10-CM

## 2018-04-09 MED ORDER — LIDOCAINE VISCOUS HCL 2 % MT SOLN
15.0000 mL | Freq: Once | OROMUCOSAL | Status: AC
  Administered 2018-04-09: 15 mL via OROMUCOSAL
  Filled 2018-04-09: qty 15

## 2018-04-09 MED ORDER — BENZOCAINE 20 % MT AERO
INHALATION_SPRAY | Freq: Once | OROMUCOSAL | Status: AC
  Administered 2018-04-09: 20:00:00 via OROMUCOSAL
  Filled 2018-04-09: qty 57

## 2018-04-09 NOTE — ED Provider Notes (Signed)
[facility] DEPT Provider Note   CSN: [id number] Arrival date & time: 04/09/18  1809     History   Chief Complaint Chief Complaint  Patient presents with  . food stuck in throat    HPI Kelly Grimes is a 77 y.o. female.  The history is provided by medical records and the patient. No language interpreter was used.  Sore Throat  This is a new problem. The current episode started less than 1 hour ago. The problem occurs constantly. The problem has not changed since onset.Associated symptoms include shortness of breath (resolved). Pertinent negatives include no chest pain, no abdominal pain and no headaches. Nothing aggravates the symptoms. Nothing relieves the symptoms. She has tried nothing for the symptoms. The treatment provided no relief.    Past Medical History:  Diagnosis Date  . Arthritis   . Bruises easily   . Glaucoma    Pre glaucoma Dr. Ellie Lunch / "borderline"  . Headache    Dr. Krista Blue  . Hyperlipidemia   . Melanoma ([place]) 1993   Left arm Dr Allyson Sabal  . Moderate aortic insufficiency 12/11  . MVP (mitral valve prolapse) 12/11/2015  . Osteopenia     Patient Active Problem List   Diagnosis Date Noted  . MVP (mitral valve prolapse) 12/11/2015  . Palpitations 11/02/2015  . Benign essential HTN 07/17/2015  . History of total knee arthroplasty 03/24/2015  . Distal radius fracture 11/16/2013  . Moderate aortic regurgitation     Past Surgical History:  Procedure Laterality Date  . EYE SURGERY     od cataract  . HERNIA REPAIR     right inguinal/femoral  . JOINT REPLACEMENT     hip  . OPEN REDUCTION INTERNAL FIXATION (ORIF) DISTAL RADIAL FRACTURE Right 11/16/2013   Procedure: OPEN REDUCTION INTERNAL FIXATION (ORIF) RIGHT DISTAL RADIUS FRACTURE WITH REPAIR AS NEEDED AND ALLOGRAFT BONE GRAFT;  Surgeon: Roseanne Kaufman, MD;  Location: [facility];  Service: Orthopedics;  Laterality: Right;  . TOTAL KNEE ARTHROPLASTY Right 03/24/2015   Procedure:  RIGHT TOTAL KNEE ARTHROPLASTY;  Surgeon: Latanya Maudlin, MD;  [facility];  Service: Orthopedics;  Laterality: Right;     OB History   None      Home Medications    Prior to Admission medications   Medication Sig Start Date End Date Taking? Authorizing Provider  brinzolamide (AZOPT) 1 % ophthalmic suspension Place 1 drop into both eyes 2 (two) times daily.    [provider]  clobetasol ointment (TEMOVATE) 0.92 % Apply 1 application topically daily as needed (skin).  07/08/14   [provider]  Travoprost, BAK Free, (TRAVATAN) 0.004 % SOLN ophthalmic solution Place 1 drop into both eyes at bedtime.    [provider]    Family History Family History  Problem Relation Age of Onset  . Alzheimer's disease Mother   . Pancreatic cancer Father   . Breast cancer Neg Hx     Social History Social History   Tobacco Use  . Smoking status: Never Smoker  . Smokeless tobacco: Never Used  Substance Use Topics  . Alcohol use: No  . Drug use: No     Allergies   Patient has no known allergies.   Review of Systems Review of Systems  Constitutional: Negative for chills, diaphoresis, fatigue and fever.  HENT: Positive for voice change. Negative for congestion and trouble swallowing.   Eyes: Negative for visual disturbance.  Respiratory: Positive for choking and shortness of breath (resolved). Negative for  apnea, cough, chest tightness, wheezing and stridor.   Cardiovascular: Negative for chest pain, palpitations and leg swelling.  Gastrointestinal: Negative for abdominal pain, constipation, diarrhea, nausea and vomiting.  Genitourinary: Negative for dysuria.  Musculoskeletal: Positive for neck pain. Negative for back pain and neck stiffness.  Skin: Negative for rash and wound.  Neurological: Negative for light-headedness and headaches.  Psychiatric/Behavioral: Negative for agitation.  All other systems reviewed and are negative.    Physical  Exam Updated Vital Signs BP (!) 157/76   Pulse 88   Temp 98.8 F (37.1 C) (Oral)   Resp 15   SpO2 100%   Physical Exam  Constitutional: She appears well-developed and well-nourished. No distress.  HENT:  Head: Normocephalic and atraumatic.  Right Ear: External ear normal.  Left Ear: External ear normal.  Nose: Nose normal.  Mouth/Throat: Oropharynx is clear and moist. No oropharyngeal exudate.  Eyes: Pupils are equal, round, and reactive to light. Conjunctivae and EOM are normal.  Neck: Normal range of motion. Neck supple.  Cardiovascular: Normal rate and regular rhythm.  Murmur heard. Pulmonary/Chest: Effort normal and breath sounds normal. No stridor. No respiratory distress. She has no wheezes. She has no rales. She exhibits no tenderness.  Abdominal: Soft. There is no tenderness. There is no guarding.  Musculoskeletal: She exhibits no edema or tenderness.  Lymphadenopathy:    She has no cervical adenopathy.  Neurological: She is alert. No sensory deficit. She exhibits normal muscle tone.  Skin: Skin is warm and dry. Capillary refill takes less than 2 seconds. No rash noted. She is not diaphoretic. No erythema.  Psychiatric: She has a normal mood and affect.  Nursing note and vitals reviewed.           ED Treatments / Results  Labs (all labs ordered are listed, but only abnormal results are displayed) Labs Reviewed - No data to display  EKG None  Radiology No results found.  Procedures Fiberoptic laryngoscopy Date/Time: 04/09/2018 8:05 PM Performed by: Courtney Paris, MD Authorized by: Courtney Paris, MD  Consent: Verbal consent obtained. Risks and benefits: risks, benefits and alternatives were discussed Consent given by: patient Patient understanding: patient states understanding of the procedure being performed Patient identity confirmed: verbally with patient and provided demographic data Time out: Immediately prior to procedure a  "time out" was called to verify the correct patient, procedure, equipment, support staff and site/side marked as required. Preparation: Patient was prepped and draped in the usual sterile fashion. Local anesthesia used: yes  Anesthesia: Local anesthesia used: yes Local anesthetic: hurricane spray and viscous lodicaine.  Sedation: Patient sedated: no  Patient tolerance: Patient tolerated the procedure well with no immediate complications    (including critical care time)  Medications Ordered in ED Medications  Benzocaine (HURRCAINE) 20 % mouth spray ( Mouth/Throat Given 04/09/18 1931)  lidocaine (XYLOCAINE) 2 % viscous mouth solution 15 mL (15 mLs Mouth/Throat Given 04/09/18 1931)     Initial Impression / Assessment and Plan / ED Course  I have reviewed the triage vital signs and the nursing notes.  Pertinent labs & imaging results that were available during my care of the patient were reviewed by me and considered in my medical decision making (see chart for details).     Kelly Grimes is a 77 y.o. female with a past medical history significant for hypertension, hyperlipidemia, and both mitral valve prolapse and aortic regurgitation who presents with choking and throat pain.  Patient is coming by husband who reports  that 45 minutes ago, patient was eating cantaloupe at home when she choked on 2 pieces.  She reports that she could not breathe and her husband quickly to the Heimlich maneuver.  She reports that he got one piece of the cantaloupe out but a second piece is still stuck in her mid throat.  She reports that her voice sounds different and it is still sitting near her vocal cords by sensation.  She says it is not painful in her chest and she is tolerating liquids.  She has no difficulty swallowing.  She says she does not have a difficult to breathing but she did earlier.  She denies any cough, congestion, or current shortness of breath.  She feels there is still a piece stuck in  her mid throat.  She has never had this happen before and has no history of esophageal or laryngeal abnormality's.  On exam, no stridor was appreciated.  Lungs were clear.  Chest was nontender.  Voice was clear however he reports that sounds different than normal.  Vital signs reassuring on arrival.  Clinically based on patient's description I suspect patient has a piece of food stuck near her vocal cords.  As I do not suspect she has anything stuck in her esophagus as she is tolerating p.o., I do not feel glucagon or a fluid like Coca-Cola would help.  Since it sounds like it is closer to her airway with the voice change, otolaryngology will be called for evaluation.  Patient is currently on room air resting comfortably.  6:52 PM Spoke with otolaryngology requested we take a look with the nasopharyngoscope.  This will be performed.  Fiberoptic laryngoscopic he did not show any evidence of foreign body or cantaloupe.  Suspect irritation from the Heimlich and previous foreign body cause her symptoms.  Patient is able to tolerate eating and drinking and was felt stable for discharge home.  Patient will follow with PCP and understood return precautions.  Patient discharged in good condition.   Final Clinical Impressions(s) / ED Diagnoses   Final diagnoses:  Choking, initial encounter    ED Discharge Orders    None     Clinical Impression: 1. Choking, initial encounter     Disposition: Discharge  Condition: Good  I have discussed the results, Dx and Tx plan with the pt(& family if present). He/she/they expressed understanding and agree(s) with the plan. Discharge instructions discussed at great length. Strict return precautions discussed and pt &/or family have verbalized understanding of the instructions. No further questions at time of discharge.    New Prescriptions   No medications on file    Follow Up: Leighton Ruff, [place]  [id number] [place] [facility] [id number] [place] [phone]       Tegeler, Gwenyth Allegra, MD 04/10/18 Shelah Lewandowsky

## 2018-04-09 NOTE — ED Triage Notes (Signed)
Pt states she chocked on a piece of cantaloupe this evening. Pt's husband did heimlich maneuver but pt feels she still has a portion of the cantaloupe in her airway.

## 2018-04-09 NOTE — Discharge Instructions (Signed)
Your exam today did not show evidence of retained cantaloupe in the airway.  I suspect there was irritation after the Heimlich maneuver was performed.  Please stay hydrated and drink soothing fluids for the next few days.  Please follow-up with your primary doctor for further management.  If any symptoms change or worsen, please return to the nearest emergency department.

## 2018-04-09 NOTE — ED Notes (Signed)
Patient tolerating PO fluids 

## 2018-05-03 DIAGNOSIS — Z23 Encounter for immunization: Secondary | ICD-10-CM | POA: Diagnosis not present

## 2018-07-19 ENCOUNTER — Other Ambulatory Visit (HOSPITAL_COMMUNITY): Payer: Medicare Other

## 2018-07-24 NOTE — Progress Notes (Signed)
Cardiology Office Note    Date:  07/25/2018   ID:  Kelly Grimes, DOB 10/08/1940, MRN [id number]  PCP:  Leighton Ruff, MD  Cardiologist: Fransico Him, MD EPS: None  Chief Complaint  Patient presents with  . Follow-up    History of Present Illness:  Kelly Grimes is a 77 y.o. female with history of moderate AR.  2D echo 07/21/2017 normal LVEF 60 to 65% with mild LVH and moderate aortic regurgitation by report but PHT was in the mild range at 545 ms. Last saw Dr. Radford Pax 07/2017 and was having some lightheadedness so ETT was ordered to rule out atypical angina and was normal.  Last saw Dr. Radford Pax 07/21/17 who had lightheadedness and ETT was normal.  Patient comes in for yearly f/u. Walks 2 miles/day and works in yard.  Denies chest pain, palpitations, dyspnea, dyspnea on exertion, dizziness or presyncope.  Eats a very heart healthy diet with lots of fruits and vegetables.  Labs checked regularly by Dr. Drema Dallas.  LDL was 116 01/04/2018 HDL 59 and cholesterol 185  Past Medical History:  Diagnosis Date  . Arthritis   . Bruises easily   . Glaucoma    Pre glaucoma Dr. Ellie Lunch / "borderline"  . Headache    Dr. Krista Blue  . Hyperlipidemia   . Melanoma ([place]) 1993   Left arm Dr Allyson Sabal  . Moderate aortic insufficiency 12/11  . MVP (mitral valve prolapse) 12/11/2015  . Osteopenia     Past Surgical History:  Procedure Laterality Date  . EYE SURGERY     od cataract  . HERNIA REPAIR     right inguinal/femoral  . JOINT REPLACEMENT     hip  . OPEN REDUCTION INTERNAL FIXATION (ORIF) DISTAL RADIAL FRACTURE Right 11/16/2013   Procedure: OPEN REDUCTION INTERNAL FIXATION (ORIF) RIGHT DISTAL RADIUS FRACTURE WITH REPAIR AS NEEDED AND ALLOGRAFT BONE GRAFT;  Surgeon: Roseanne Kaufman, MD;  Location: [place];  Service: Orthopedics;  Laterality: Right;  . TOTAL KNEE ARTHROPLASTY Right 03/24/2015   Procedure: RIGHT TOTAL KNEE ARTHROPLASTY;  Surgeon: Latanya Maudlin, MD;  Location: [facility];  Service:  Orthopedics;  Laterality: Right;    Current Medications: Current Meds  Medication Sig  . brinzolamide (AZOPT) 1 % ophthalmic suspension Place 1 drop into both eyes 2 (two) times daily.  . clobetasol ointment (TEMOVATE) 7.03 % Apply 1 application topically daily as needed (skin).   . Travoprost, BAK Free, (TRAVATAN) 0.004 % SOLN ophthalmic solution Place 1 drop into both eyes at bedtime.     Allergies:   Patient has no known allergies.   Social History   Socioeconomic History  . Marital status: Married    Spouse name: Not on file  . Number of children: Not on file  . Years of education: Not on file  . Highest education level: Not on file  Occupational History  . Not on file  Social Needs  . Financial resource strain: Not on file  . Food insecurity:    Worry: Not on file    Inability: Not on file  . Transportation needs:    Medical: Not on file    Non-medical: Not on file  Tobacco Use  . Smoking status: Never Smoker  . Smokeless tobacco: Never Used  Substance and Sexual Activity  . Alcohol use: No  . Drug use: No  . Sexual activity: Not on file  Lifestyle  . Physical activity:    Days per week: Not on file    Minutes per  session: Not on file  . Stress: Not on file  Relationships  . Social connections:    Talks on phone: Not on file    Gets together: Not on file    Attends religious service: Not on file    Active member of club or organization: Not on file    Attends meetings of clubs or organizations: Not on file    Relationship status: Not on file  Other Topics Concern  . Not on file  Social History Narrative  . Not on file     Family History:  The patient's family history includes Alzheimer's disease in her mother; Pancreatic cancer in her father.   ROS:   Please see the history of present illness.    Review of Systems  Constitution: Negative.  HENT: Negative.   Eyes: Negative.   Cardiovascular: Negative.   Respiratory: Negative.     Hematologic/Lymphatic: Negative.   Musculoskeletal: Negative.  Negative for joint pain.  Gastrointestinal: Negative.   Genitourinary: Negative.   Neurological: Negative.    All other systems reviewed and are negative.   PHYSICAL EXAM:   VS:  BP 130/70   Pulse 66   Ht 5\' 3"  (1.6 m)   Wt 100 lb 6.4 oz (45.5 kg)   SpO2 93%   BMI 17.79 kg/m   Physical Exam  GEN: Thin, looks younger than stated age, in no acute distress  Neck: no JVD, carotid bruits, or masses Cardiac:RRR; 2/6 to 3/6 diastolic murmur at the left sternal border and right sternal border Respiratory:  clear to auscultation bilaterally, normal work of breathing GI: soft, nontender, nondistended, + BS Ext: without cyanosis, clubbing, or edema, Good distal pulses bilaterally Neuro:  Alert and Oriented x 3 Psych: euthymic mood, full affect  Wt Readings from Last 3 Encounters:  07/25/18 100 lb 6.4 oz (45.5 kg)  07/21/17 98 lb 9.6 oz (44.7 kg)  07/19/16 101 lb 12.8 oz (46.2 kg)      Studies/Labs Reviewed:   EKG:  EKG is  ordered today.  The ekg ordered today demonstrates normal sinus rhythm with LVH nonspecific ST-T wave changes  Recent Labs: No results found for requested labs within last 8760 hours.   Lipid Panel No results found for: CHOL, TRIG, HDL, CHOLHDL, VLDL, LDLCALC, LDLDIRECT  Additional studies/ records that were reviewed today include:  ETT 07/2017  Blood pressure demonstrated a normal response to exercise.  There was no ST segment deviation noted during stress.  No T wave inversion was noted during stress.  The patient experienced no angina during the stress test.  Overall, the patient's exercise capacity was normal.  [facility] Treadmill Score: low risk   Negative stress test without evidence of ischemia at given workload.    2D echo 07/2017------------------------------------------------------------------- Study Conclusions   - Left ventricle: The cavity size was normal. Wall thickness  was   increased in a pattern of mild LVH. Systolic function was normal.   The estimated ejection fraction was in the range of 60% to 65%.   Wall motion was normal; there were no regional wall motion   abnormalities. Features are consistent with a pseudonormal left   ventricular filling pattern, with concomitant abnormal relaxation   and increased filling pressure (grade 2 diastolic dysfunction). - Aortic valve: There was moderate regurgitation.       ASSESSMENT:    1. Moderate aortic regurgitation      PLAN:  In order of problems listed above:  Aortic regurgitation moderate on echo 07/2017.  She had echo today which is not formally read yet.  We will call her with results.  Patient asymptomatic.  Follow-up with Dr. Radford Pax in 1 year.      Medication Adjustments/Labs and Tests Ordered: Current medicines are reviewed at length with the patient today.  Concerns regarding medicines are outlined above.  Medication changes, Labs and Tests ordered today are listed in the Patient Instructions below. Patient Instructions  Medication Instructions:  Your physician recommends that you continue on your current medications as directed. Please refer to the Current Medication list given to you today.  If you need a refill on your cardiac medications before your next appointment, please call your pharmacy.   Lab work: None Ordered  If you have labs (blood work) drawn today and your tests are completely normal, you will receive your results only by: Marland Kitchen MyChart Message (if you have MyChart) OR . A paper copy in the mail If you have any lab test that is abnormal or we need to change your treatment, we will call you to review the results.  Testing/Procedures: Your physician has requested that you have an echocardiogram in 1 year. Echocardiography is a painless test that uses sound waves to create images of your heart. It provides your doctor with information about the size and shape of your  heart and how well your heart's chambers and valves are working. This procedure takes approximately one hour. There are no restrictions for this procedure.    Follow-Up: At [facility], you and your health needs are our priority.  As part of our continuing mission to provide you with exceptional heart care, we have created designated Provider Care Teams.  These Care Teams include your primary Cardiologist (physician) and Advanced Practice Providers (APPs -  Physician Assistants and Nurse Practitioners) who all work together to provide you with the care you need, when you need it. . You will need a follow up appointment in 1 year.  Please call our office 2 months in advance to schedule this appointment.  You may see Fransico Him, MD or one of the following Advanced Practice Providers on your designated Care Team:   . Lyda Jester, PA-C . Dayna Dunn, PA-C . Ermalinda Barrios, PA-C  Any Other Special Instructions Will Be Listed Below (If Applicable).       Sumner Boast, PA-C  07/25/2018 10:49 AM    [facility] [address], [place], [place]  [address] Phone: [phone]; Fax: [phone]

## 2018-07-25 ENCOUNTER — Ambulatory Visit: Payer: Medicare Other | Admitting: Physician Assistant

## 2018-07-25 ENCOUNTER — Encounter: Payer: Self-pay | Admitting: Physician Assistant

## 2018-07-25 ENCOUNTER — Other Ambulatory Visit: Payer: Self-pay

## 2018-07-25 ENCOUNTER — Encounter (INDEPENDENT_AMBULATORY_CARE_PROVIDER_SITE_OTHER): Payer: Self-pay

## 2018-07-25 ENCOUNTER — Ambulatory Visit (HOSPITAL_COMMUNITY): Payer: Medicare Other | Attending: Cardiology

## 2018-07-25 VITALS — BP 130/70 | HR 66 | Ht 63.0 in | Wt 100.4 lb

## 2018-07-25 DIAGNOSIS — I351 Nonrheumatic aortic (valve) insufficiency: Secondary | ICD-10-CM | POA: Diagnosis not present

## 2018-07-25 NOTE — Patient Instructions (Signed)
Medication Instructions:  Your physician recommends that you continue on your current medications as directed. Please refer to the Current Medication list given to you today.  If you need a refill on your cardiac medications before your next appointment, please call your pharmacy.   Lab work: None Ordered  If you have labs (blood work) drawn today and your tests are completely normal, you will receive your results only by: Marland Kitchen MyChart Message (if you have MyChart) OR . A paper copy in the mail If you have any lab test that is abnormal or we need to change your treatment, we will call you to review the results.  Testing/Procedures: Your physician has requested that you have an echocardiogram in 1 year. Echocardiography is a painless test that uses sound waves to create images of your heart. It provides your doctor with information about the size and shape of your heart and how well your heart's chambers and valves are working. This procedure takes approximately one hour. There are no restrictions for this procedure.    Follow-Up: At [facility], you and your health needs are our priority.  As part of our continuing mission to provide you with exceptional heart care, we have created designated Provider Care Teams.  These Care Teams include your primary Cardiologist (physician) and Advanced Practice Providers (APPs -  Physician Assistants and Nurse Practitioners) who all work together to provide you with the care you need, when you need it. . You will need a follow up appointment in 1 year.  Please call our office 2 months in advance to schedule this appointment.  You may see Fransico Him, MD or one of the following Advanced Practice Providers on your designated Care Team:   . Lyda Jester, PA-C . Dayna Dunn, PA-C . Ermalinda Barrios, PA-C  Any Other Special Instructions Will Be Listed Below (If Applicable).

## 2018-07-27 ENCOUNTER — Other Ambulatory Visit: Payer: Self-pay | Admitting: Obstetrics and Gynecology

## 2018-07-27 DIAGNOSIS — Z1231 Encounter for screening mammogram for malignant neoplasm of breast: Secondary | ICD-10-CM

## 2018-08-22 DIAGNOSIS — Z0120 Encounter for dental examination and cleaning without abnormal findings: Secondary | ICD-10-CM | POA: Diagnosis not present

## 2018-09-05 DIAGNOSIS — H401122 Primary open-angle glaucoma, left eye, moderate stage: Secondary | ICD-10-CM | POA: Diagnosis not present

## 2018-09-05 DIAGNOSIS — H401112 Primary open-angle glaucoma, right eye, moderate stage: Secondary | ICD-10-CM | POA: Diagnosis not present

## 2018-09-07 ENCOUNTER — Ambulatory Visit
Admission: RE | Admit: 2018-09-07 | Discharge: 2018-09-07 | Disposition: A | Discharge status: Home / Self Care | Payer: Medicare Other | Source: Ambulatory Visit | Attending: Obstetrics and Gynecology | Admitting: Obstetrics and Gynecology

## 2018-09-07 DIAGNOSIS — Z1231 Encounter for screening mammogram for malignant neoplasm of breast: Secondary | ICD-10-CM

## 2018-09-12 DIAGNOSIS — H401132 Primary open-angle glaucoma, bilateral, moderate stage: Secondary | ICD-10-CM | POA: Diagnosis not present

## 2018-09-26 DIAGNOSIS — J0100 Acute maxillary sinusitis, unspecified: Secondary | ICD-10-CM | POA: Diagnosis not present

## 2018-10-16 DIAGNOSIS — H10413 Chronic giant papillary conjunctivitis, bilateral: Secondary | ICD-10-CM | POA: Diagnosis not present

## 2018-11-26 DIAGNOSIS — Z01419 Encounter for gynecological examination (general) (routine) without abnormal findings: Secondary | ICD-10-CM | POA: Diagnosis not present

## 2018-11-26 DIAGNOSIS — Z681 Body mass index (BMI) 19 or less, adult: Secondary | ICD-10-CM | POA: Diagnosis not present

## 2018-12-19 DIAGNOSIS — H401131 Primary open-angle glaucoma, bilateral, mild stage: Secondary | ICD-10-CM | POA: Diagnosis not present

## 2019-02-20 DIAGNOSIS — H401131 Primary open-angle glaucoma, bilateral, mild stage: Secondary | ICD-10-CM | POA: Diagnosis not present

## 2019-02-20 DIAGNOSIS — H5000 Unspecified esotropia: Secondary | ICD-10-CM | POA: Diagnosis not present

## 2019-03-04 DIAGNOSIS — L570 Actinic keratosis: Secondary | ICD-10-CM | POA: Diagnosis not present

## 2019-03-04 DIAGNOSIS — L821 Other seborrheic keratosis: Secondary | ICD-10-CM | POA: Diagnosis not present

## 2019-03-04 DIAGNOSIS — L814 Other melanin hyperpigmentation: Secondary | ICD-10-CM | POA: Diagnosis not present

## 2019-03-04 DIAGNOSIS — D225 Melanocytic nevi of trunk: Secondary | ICD-10-CM | POA: Diagnosis not present

## 2019-03-29 DIAGNOSIS — Z0000 Encounter for general adult medical examination without abnormal findings: Secondary | ICD-10-CM | POA: Diagnosis not present

## 2019-07-24 ENCOUNTER — Telehealth: Payer: Self-pay

## 2019-07-24 NOTE — Telephone Encounter (Signed)
YOUR CARDIOLOGY TEAM HAS ARRANGED FOR AN E-VISIT FOR YOUR APPOINTMENT - PLEASE REVIEW IMPORTANT INFORMATION BELOW SEVERAL DAYS PRIOR TO YOUR APPOINTMENT  Due to the recent COVID-19 pandemic, we are transitioning in-person office visits to tele-medicine visits in an effort to decrease unnecessary exposure to our patients, their families, and staff. These visits are billed to your insurance just like a normal visit is. We also encourage you to sign up for MyChart if you have not already done so. You will need a smartphone if possible. For patients that do not have this, we can still complete the visit using a regular telephone but do prefer a smartphone to enable video when possible. You may have a family member that lives with you that can help. If possible, we also ask that you have a blood pressure cuff and scale at home to measure your blood pressure, heart rate and weight prior to your scheduled appointment. Patients with clinical needs that need an in-person evaluation and testing will still be able to come to the office if absolutely necessary. If you have any questions, feel free to call our office.   THE DAY OF YOUR APPOINTMENT  Approximately 15 minutes prior to your scheduled appointment, you will receive a telephone call from one of [facility] team - your caller ID may say "Unknown caller."  Our staff will confirm medications, vital signs for the day and any symptoms you may be experiencing. Please have this information available prior to the time of visit start. It may also be helpful for you to have a pad of paper and pen handy for any instructions given during your visit. They will also walk you through joining the smartphone meeting if this is a video visit.    CONSENT FOR TELE-HEALTH VISIT - PLEASE REVIEW  I hereby voluntarily request, consent and authorize [facility] and its employed or contracted physicians, physician assistants, nurse practitioners or other licensed health care  professionals (the Practitioner), to provide me with telemedicine health care services (the "Services") as deemed necessary by the treating Practitioner. I acknowledge and consent to receive the Services by the Practitioner via telemedicine. I understand that the telemedicine visit will involve communicating with the Practitioner through live audiovisual communication technology and the disclosure of certain medical information by electronic transmission. I acknowledge that I have been given the opportunity to request an in-person assessment or other available alternative prior to the telemedicine visit and am voluntarily participating in the telemedicine visit.  I understand that I have the right to withhold or withdraw my consent to the use of telemedicine in the course of my care at any time, without affecting my right to future care or treatment, and that the Practitioner or I may terminate the telemedicine visit at any time. I understand that I have the right to inspect all information obtained and/or recorded in the course of the telemedicine visit and may receive copies of available information for a reasonable fee.  I understand that some of the potential risks of receiving the Services via telemedicine include:  Marland Kitchen Delay or interruption in medical evaluation due to technological equipment failure or disruption; . Information transmitted may not be sufficient (e.g. poor resolution of images) to allow for appropriate medical decision making by the Practitioner; and/or  . In rare instances, security protocols could fail, causing a breach of personal health information.  Furthermore, I acknowledge that it is my responsibility to provide information about my medical history, conditions and care that is complete and  accurate to the best of my ability. I acknowledge that Practitioner's advice, recommendations, and/or decision may be based on factors not within their control, such as incomplete or inaccurate  data provided by me or distortions of diagnostic images or specimens that may result from electronic transmissions. I understand that the practice of medicine is not an exact science and that Practitioner makes no warranties or guarantees regarding treatment outcomes. I acknowledge that I will receive a copy of this consent concurrently upon execution via email to the email address I last provided but may also request a printed copy by calling the office of [place].    I understand that my insurance will be billed for this visit.   I have read or had this consent read to me. . I understand the contents of this consent, which adequately explains the benefits and risks of the Services being provided via telemedicine.  . I have been provided ample opportunity to ask questions regarding this consent and the Services and have had my questions answered to my satisfaction. . I give my informed consent for the services to be provided through the use of telemedicine in my medical care  By participating in this telemedicine visit I agree to the above.

## 2019-07-25 NOTE — Progress Notes (Signed)
Virtual Visit via Telephone Note   This visit type was conducted due to national recommendations for restrictions regarding the COVID-19 Pandemic (e.g. social distancing) in an effort to limit this patient's exposure and mitigate transmission in our community.  Due to her co-morbid illnesses, this patient is at least at moderate risk for complications without adequate follow up.  This format is felt to be most appropriate for this patient at this time.  The patient did not have access to video technology/had technical difficulties with video requiring transitioning to audio format only (telephone).  All issues noted in this document were discussed and addressed.  No physical exam could be performed with this format.  Please refer to the patient's chart for her  consent to telehealth for [facility].   Evaluation Performed:  Follow-up visit  This visit type was conducted due to national recommendations for restrictions regarding the COVID-19 Pandemic (e.g. social distancing).  This format is felt to be most appropriate for this patient at this time.  All issues noted in this document were discussed and addressed.  No physical exam was performed (except for noted visual exam findings with Video Visits).  Please refer to the patient's chart (MyChart message for video visits and phone note for telephone visits) for the patient's consent to telehealth for [facility].  Date:  07/26/2019   ID:  Kelly Grimes, DOB 1941-01-31, MRN [id number]  Patient Location:  Home  Provider location:   [place]  PCP:  Leighton Ruff, MD  Cardiologist:  Fransico Him, MD  Electrophysiologist:  None   Chief Complaint:  Moderate AR  History of Present Illness:    Kelly Grimes is a 78 y.o. female who presents via audio/video conferencing for a telehealth visit today.    Kelly Grimes is a 78 y.o. female with a hx of moderate AR.  Her last echo was 07/21/2017 showing normal LVF with EF  60-65% with mild LVH and mild AR by report with PHT 449msec.  She is here today for followup and is doing well.  She denies any chest pain or pressure, SOB, DOE, PND, orthopnea, LE edema, dizziness, palpitations or syncope. SHe is compliant with her meds and is tolerating meds with no SE.    The patient does not have symptoms concerning for COVID-19 infection (fever, chills, cough, or new shortness of breath).    Prior CV studies:   The following studies were reviewed today:    Past Medical History:  Diagnosis Date  . Arthritis   . Bruises easily   . Glaucoma    Pre glaucoma Dr. Ellie Lunch / "borderline"  . Headache    Dr. Krista Blue  . Hyperlipidemia   . Melanoma ([place]) 1993   Left arm Dr Allyson Sabal  . Moderate aortic insufficiency 12/11  . MVP (mitral valve prolapse) 12/11/2015  . Osteopenia    Past Surgical History:  Procedure Laterality Date  . EYE SURGERY     od cataract  . HERNIA REPAIR     right inguinal/femoral  . JOINT REPLACEMENT     hip  . OPEN REDUCTION INTERNAL FIXATION (ORIF) DISTAL RADIAL FRACTURE Right 11/16/2013   Procedure: OPEN REDUCTION INTERNAL FIXATION (ORIF) RIGHT DISTAL RADIUS FRACTURE WITH REPAIR AS NEEDED AND ALLOGRAFT BONE GRAFT;  Surgeon: Roseanne Kaufman, MD;  Location: [place];  Service: Orthopedics;  Laterality: Right;  . TOTAL KNEE ARTHROPLASTY Right 03/24/2015   Procedure: RIGHT TOTAL KNEE ARTHROPLASTY;  Surgeon: Latanya Maudlin, MD;  Location[facility];  Service:  Orthopedics;  Laterality: Right;     Current Meds  Medication Sig  . Ascorbic Acid (VITAMIN C) 100 MG tablet Take 100 mg by mouth daily.  . brinzolamide (AZOPT) 1 % ophthalmic suspension Place 1 drop into both eyes 2 (two) times daily.  . calcium citrate (CALCITRATE - DOSED IN MG ELEMENTAL CALCIUM) 950 (200 Ca) MG tablet Take 200 mg of elemental calcium by mouth daily.  . clobetasol ointment (TEMOVATE) [id number] % Apply 1 application topically daily as needed (skin).   . Omega-3 Fatty Acids (FISH OIL) 1000 MG  CAPS Take by mouth.  . Travoprost, BAK Free, (TRAVATAN) 0.004 % SOLN ophthalmic solution Place 1 drop into both eyes at bedtime.     Allergies:   Patient has no known allergies.   Social History   Tobacco Use  . Smoking status: Never Smoker  . Smokeless tobacco: Never Used  Substance Use Topics  . Alcohol use: No  . Drug use: No     Family Hx: The patient's family history includes Alzheimer's disease in her mother; Pancreatic cancer in her father. There is no history of Breast cancer.  ROS:   Please see the history of present illness.     All other systems reviewed and are negative.   Labs/Other Tests and Data Reviewed:    Recent Labs: No results found for requested labs within last 8760 hours.   Recent Lipid Panel No results found for: CHOL, TRIG, HDL, CHOLHDL, LDLCALC, LDLDIRECT  Wt Readings from Last 3 Encounters:  07/26/19 96 lb 12.8 oz (43.9 kg)  07/25/18 100 lb 6.4 oz (45.5 kg)  07/21/17 98 lb 9.6 oz (44.7 kg)     Objective:    Vital Signs:  Ht 5\' 3"  (1.6 m)   Wt 96 lb 12.8 oz (43.9 kg)   BMI 17.15 kg/m     ASSESSMENT & PLAN:    1.  Aortic insufficiency -mild by echo a year ago with PHT 442msec -repeat 2D echo to make sure AI has not progressed.- will get in the summer due to COVID 19  2.  Hx of MVP -no evidence of MVP on echo 07/2018\  3.  HLD -followed by PCP -currently diet controlled  COVID-19 Education: The signs and symptoms of COVID-19 were discussed with the patient and how to seek care for testing (follow up with PCP or arrange E-visit).  The importance of social distancing was discussed today.  Patient Risk:   After full review of this patient's clinical status, I feel that they are at least moderate risk at this time.  Time:   Today, I have spent 20 minutes on telemedicine discussing medical problems including AI, MVP, HLD.  We also reviewed the symptoms of COVID 19 and the ways to protect against contracting the virus with  telehealth technology.  I spent an additional 5 minutes reviewing patient's chart including labs and 2D echo.  Medication Adjustments/Labs and Tests Ordered: Current medicines are reviewed at length with the patient today.  Concerns regarding medicines are outlined above.  Tests Ordered: No orders of the defined types were placed in this encounter.  Medication Changes: No orders of the defined types were placed in this encounter.   Disposition:  Follow up in 1 year(s)  Signed, Fransico Him, MD  07/26/2019 8:00 AM    [facility]

## 2019-07-26 ENCOUNTER — Encounter: Payer: Self-pay | Admitting: Cardiology

## 2019-07-26 ENCOUNTER — Telehealth (INDEPENDENT_AMBULATORY_CARE_PROVIDER_SITE_OTHER): Payer: Medicare Other | Admitting: Cardiology

## 2019-07-26 ENCOUNTER — Other Ambulatory Visit: Payer: Self-pay

## 2019-07-26 ENCOUNTER — Ambulatory Visit (HOSPITAL_COMMUNITY): Payer: Medicare Other

## 2019-07-26 VITALS — Ht 63.0 in | Wt 96.8 lb

## 2019-07-26 DIAGNOSIS — I341 Nonrheumatic mitral (valve) prolapse: Secondary | ICD-10-CM

## 2019-07-26 DIAGNOSIS — Z8774 Personal history of (corrected) congenital malformations of heart and circulatory system: Secondary | ICD-10-CM

## 2019-07-26 DIAGNOSIS — E7800 Pure hypercholesterolemia, unspecified: Secondary | ICD-10-CM

## 2019-07-26 DIAGNOSIS — E785 Hyperlipidemia, unspecified: Secondary | ICD-10-CM | POA: Diagnosis not present

## 2019-07-26 DIAGNOSIS — I351 Nonrheumatic aortic (valve) insufficiency: Secondary | ICD-10-CM | POA: Diagnosis not present

## 2019-07-26 NOTE — Patient Instructions (Signed)
Medication Instructions:  Your physician recommends that you continue on your current medications as directed. Please refer to the Current Medication list given to you today.  *If you need a refill on your cardiac medications before your next appointment, please call your pharmacy*   Testing/Procedures: Your physician has requested that you have an echocardiogram. Echocardiography is a painless test that uses sound waves to create images of your heart. It provides your doctor with information about the size and shape of your heart and how well your heart's chambers and valves are working. This procedure takes approximately one hour. There are no restrictions for this procedure.  Follow-Up: At [facility], you and your health needs are our priority.  As part of our continuing mission to provide you with exceptional heart care, we have created designated Provider Care Teams.  These Care Teams include your primary Cardiologist (physician) and Advanced Practice Providers (APPs -  Physician Assistants and Nurse Practitioners) who all work together to provide you with the care you need, when you need it.  Your next appointment:   1 year(s)  The format for your next appointment:   Either In Person or Virtual  Provider:   You may see Fransico Him, MD or one of the following Advanced Practice Providers on your designated Care Team:    Melina Copa, PA-C  Ermalinda Barrios, PA-C

## 2019-08-28 DIAGNOSIS — H401131 Primary open-angle glaucoma, bilateral, mild stage: Secondary | ICD-10-CM | POA: Diagnosis not present

## 2019-10-07 DIAGNOSIS — S0181XA Laceration without foreign body of other part of head, initial encounter: Secondary | ICD-10-CM | POA: Diagnosis not present

## 2019-10-14 DIAGNOSIS — Z4802 Encounter for removal of sutures: Secondary | ICD-10-CM | POA: Diagnosis not present

## 2019-10-14 DIAGNOSIS — R1904 Left lower quadrant abdominal swelling, mass and lump: Secondary | ICD-10-CM | POA: Diagnosis not present

## 2019-10-16 ENCOUNTER — Other Ambulatory Visit: Payer: Self-pay | Admitting: Family Medicine

## 2019-10-16 DIAGNOSIS — R1904 Left lower quadrant abdominal swelling, mass and lump: Secondary | ICD-10-CM

## 2019-10-21 DIAGNOSIS — H43811 Vitreous degeneration, right eye: Secondary | ICD-10-CM | POA: Diagnosis not present

## 2019-10-31 ENCOUNTER — Other Ambulatory Visit: Payer: Medicare Other

## 2019-11-13 ENCOUNTER — Ambulatory Visit
Admission: RE | Admit: 2019-11-13 | Discharge: 2019-11-13 | Disposition: A | Discharge status: Home / Self Care | Payer: Medicare Other | Source: Ambulatory Visit | Attending: Family Medicine | Admitting: Family Medicine

## 2019-11-13 DIAGNOSIS — R1904 Left lower quadrant abdominal swelling, mass and lump: Secondary | ICD-10-CM

## 2019-11-13 DIAGNOSIS — R1909 Other intra-abdominal and pelvic swelling, mass and lump: Secondary | ICD-10-CM | POA: Diagnosis not present

## 2019-11-13 MED ORDER — IOPAMIDOL (ISOVUE-300) INJECTION 61%
100.0000 mL | Freq: Once | INTRAVENOUS | Status: AC | PRN
  Administered 2019-11-13: 100 mL via INTRAVENOUS

## 2019-11-18 DIAGNOSIS — Z96651 Presence of right artificial knee joint: Secondary | ICD-10-CM | POA: Diagnosis not present

## 2019-11-18 DIAGNOSIS — Z471 Aftercare following joint replacement surgery: Secondary | ICD-10-CM | POA: Diagnosis not present

## 2019-11-25 DIAGNOSIS — Z96651 Presence of right artificial knee joint: Secondary | ICD-10-CM | POA: Diagnosis not present

## 2019-11-25 DIAGNOSIS — M25561 Pain in right knee: Secondary | ICD-10-CM | POA: Diagnosis not present

## 2019-12-02 DIAGNOSIS — M25561 Pain in right knee: Secondary | ICD-10-CM | POA: Diagnosis not present

## 2019-12-02 DIAGNOSIS — S8001XD Contusion of right knee, subsequent encounter: Secondary | ICD-10-CM | POA: Diagnosis not present

## 2019-12-17 DIAGNOSIS — H43811 Vitreous degeneration, right eye: Secondary | ICD-10-CM | POA: Diagnosis not present

## 2019-12-24 DIAGNOSIS — Z01419 Encounter for gynecological examination (general) (routine) without abnormal findings: Secondary | ICD-10-CM | POA: Diagnosis not present

## 2019-12-24 DIAGNOSIS — Z681 Body mass index (BMI) 19 or less, adult: Secondary | ICD-10-CM | POA: Diagnosis not present

## 2019-12-24 DIAGNOSIS — Z124 Encounter for screening for malignant neoplasm of cervix: Secondary | ICD-10-CM | POA: Diagnosis not present

## 2019-12-24 DIAGNOSIS — Z1231 Encounter for screening mammogram for malignant neoplasm of breast: Secondary | ICD-10-CM | POA: Diagnosis not present

## 2020-01-17 DIAGNOSIS — H903 Sensorineural hearing loss, bilateral: Secondary | ICD-10-CM | POA: Diagnosis not present

## 2020-02-07 ENCOUNTER — Telehealth: Payer: Self-pay | Admitting: Cardiology

## 2020-02-07 NOTE — Telephone Encounter (Signed)
Follow up   Please disregard the previous message there is already an echo scheduled.

## 2020-02-07 NOTE — Telephone Encounter (Signed)
New message   Patient wants an echo ordered so that she can have this done before her December appt. Please advise.

## 2020-02-12 DIAGNOSIS — Z961 Presence of intraocular lens: Secondary | ICD-10-CM | POA: Diagnosis not present

## 2020-02-12 DIAGNOSIS — H401132 Primary open-angle glaucoma, bilateral, moderate stage: Secondary | ICD-10-CM | POA: Diagnosis not present

## 2020-02-21 ENCOUNTER — Other Ambulatory Visit (HOSPITAL_COMMUNITY): Payer: Medicare Other

## 2020-03-03 DIAGNOSIS — D225 Melanocytic nevi of trunk: Secondary | ICD-10-CM | POA: Diagnosis not present

## 2020-03-03 DIAGNOSIS — L821 Other seborrheic keratosis: Secondary | ICD-10-CM | POA: Diagnosis not present

## 2020-03-03 DIAGNOSIS — L570 Actinic keratosis: Secondary | ICD-10-CM | POA: Diagnosis not present

## 2020-03-03 DIAGNOSIS — L905 Scar conditions and fibrosis of skin: Secondary | ICD-10-CM | POA: Diagnosis not present

## 2020-03-18 DIAGNOSIS — H401112 Primary open-angle glaucoma, right eye, moderate stage: Secondary | ICD-10-CM | POA: Diagnosis not present

## 2020-03-25 DIAGNOSIS — Z0120 Encounter for dental examination and cleaning without abnormal findings: Secondary | ICD-10-CM | POA: Diagnosis not present

## 2020-03-30 DIAGNOSIS — E559 Vitamin D deficiency, unspecified: Secondary | ICD-10-CM | POA: Diagnosis not present

## 2020-03-30 DIAGNOSIS — Z0000 Encounter for general adult medical examination without abnormal findings: Secondary | ICD-10-CM | POA: Diagnosis not present

## 2020-03-30 DIAGNOSIS — E7800 Pure hypercholesterolemia, unspecified: Secondary | ICD-10-CM | POA: Diagnosis not present

## 2020-03-30 DIAGNOSIS — M8588 Other specified disorders of bone density and structure, other site: Secondary | ICD-10-CM | POA: Diagnosis not present

## 2020-05-02 DIAGNOSIS — Z23 Encounter for immunization: Secondary | ICD-10-CM | POA: Diagnosis not present

## 2020-05-22 DIAGNOSIS — H401112 Primary open-angle glaucoma, right eye, moderate stage: Secondary | ICD-10-CM | POA: Diagnosis not present

## 2020-07-20 ENCOUNTER — Encounter: Payer: Self-pay | Admitting: Cardiology

## 2020-07-20 ENCOUNTER — Ambulatory Visit: Payer: Medicare Other | Admitting: Cardiology

## 2020-07-20 ENCOUNTER — Ambulatory Visit (HOSPITAL_COMMUNITY): Payer: Medicare Other | Attending: Cardiology

## 2020-07-20 ENCOUNTER — Other Ambulatory Visit: Payer: Self-pay

## 2020-07-20 VITALS — BP 132/70 | HR 83 | Ht 63.0 in | Wt 97.0 lb

## 2020-07-20 DIAGNOSIS — I351 Nonrheumatic aortic (valve) insufficiency: Secondary | ICD-10-CM | POA: Insufficient documentation

## 2020-07-20 DIAGNOSIS — T733XXA Exhaustion due to excessive exertion, initial encounter: Secondary | ICD-10-CM

## 2020-07-20 DIAGNOSIS — I341 Nonrheumatic mitral (valve) prolapse: Secondary | ICD-10-CM

## 2020-07-20 DIAGNOSIS — E7800 Pure hypercholesterolemia, unspecified: Secondary | ICD-10-CM

## 2020-07-20 NOTE — Progress Notes (Signed)
Date:  07/20/2020   ID:  Kelly Grimes, DOB [birth year], MRN [id number]  PCP:  Leighton Ruff, MD  Cardiologist:  Fransico Him, MD  Electrophysiologist:  None   Chief Complaint:  Aortic insufficiency  History of Present Illness:    Kelly Grimes is a 79 y.o. female with a hx of mild to mdoerate AR.  Her last echo 2019 showing normal LVF with EF 60-65% with mild LVH and mild AR.  She is here today for followup and is doing well.  She denies any chest pain or pressure, SOB, DOE, PND, orthopnea, LE edema, dizziness, palpitations or syncope.  She is concerned that she is cold a lot and is not sure why.  She also has been having problerms with getting dizzy at times. This occurs when she is out in the yard working and stoops down and gets up to fast or making a fast turn around the house.  Also when she is walking towards the end of her walk her legs will start to feel like "rubber" and will break out in a cold sweat. She is compliant with her meds and is tolerating meds with no SE.    Prior CV studies:   The following studies were reviewed today:  2D echo 2019  Past Medical History:  Diagnosis Date  . Aortic insufficiency 07/2010  . Arthritis   . Bruises easily   . Glaucoma    Pre glaucoma Dr. Ellie Lunch / "borderline"  . Headache    Dr. Krista Blue  . Hyperlipidemia   . Melanoma ([place]) 1993   Left arm Dr Allyson Sabal  . MVP (mitral valve prolapse) 12/11/2015  . Osteopenia    Past Surgical History:  Procedure Laterality Date  . EYE SURGERY     od cataract  . HERNIA REPAIR     right inguinal/femoral  . JOINT REPLACEMENT     hip  . OPEN REDUCTION INTERNAL FIXATION (ORIF) DISTAL RADIAL FRACTURE Right 11/16/2013   Procedure: OPEN REDUCTION INTERNAL FIXATION (ORIF) RIGHT DISTAL RADIUS FRACTURE WITH REPAIR AS NEEDED AND ALLOGRAFT BONE GRAFT;  Surgeon: Roseanne Kaufman, MD;  Location: [place];  Service: Orthopedics;  Laterality: Right;  . TOTAL KNEE ARTHROPLASTY Right 03/24/2015   Procedure: RIGHT  TOTAL KNEE ARTHROPLASTY;  Surgeon: Latanya Maudlin, MD;  Location: [facility];  Service: Orthopedics;  Laterality: Right;     Current Meds  Medication Sig  . Ascorbic Acid (VITAMIN C) 100 MG tablet Take 100 mg by mouth daily.  . brinzolamide (AZOPT) 1 % ophthalmic suspension Place 1 drop into both eyes 2 (two) times daily.  . calcium citrate (CALCITRATE - DOSED IN MG ELEMENTAL CALCIUM) 950 (200 Ca) MG tablet Take 200 mg of elemental calcium by mouth daily.  . clobetasol ointment (TEMOVATE) 4.26 % Apply 1 application topically daily as needed (skin).   . Omega-3 Fatty Acids (FISH OIL) 1000 MG CAPS Take by mouth.  . Travoprost, BAK Free, (TRAVATAN) 0.004 % SOLN ophthalmic solution Place 1 drop into both eyes at bedtime.     Allergies:   Patient has no known allergies.   Social History   Tobacco Use  . Smoking status: Never Smoker  . Smokeless tobacco: Never Used  Substance Use Topics  . Alcohol use: No  . Drug use: No     Family Hx: The patient's family history includes Alzheimer's disease in her mother; Pancreatic cancer in her father. There is no history of Breast cancer.  ROS:   Please see the history of present  illness.     All other systems reviewed and are negative.   Labs/Other Tests and Data Reviewed:    Recent Labs: No results found for requested labs within last 8760 hours.   Recent Lipid Panel No results found for: CHOL, TRIG, HDL, CHOLHDL, LDLCALC, LDLDIRECT  Wt Readings from Last 3 Encounters:  07/20/20 97 lb (44 kg)  07/26/19 96 lb 12.8 oz (43.9 kg)  07/25/18 100 lb 6.4 oz (45.5 kg)    EKG was performed today and showed NSR with LVF  Objective:    Vital Signs:  BP 132/70   Pulse 83   Ht 5\' 3"  (1.6 m)   Wt 97 lb (44 kg)   SpO2 100%   BMI 17.18 kg/m   GEN: Well nourished, well developed in no acute distress HEENT: Normal NECK: No JVD; No carotid bruits LYMPHATICS: No lymphadenopathy CARDIAC:RRR, no murmurs, rubs, gallops RESPIRATORY:  Clear to  auscultation without rales, wheezing or rhonchi  ABDOMEN: Soft, non-tender, non-distended MUSCULOSKELETAL:  No edema; No deformity  SKIN: Warm and dry NEUROLOGIC:  Alert and oriented x 3 PSYCHIATRIC:  Normal affect    ASSESSMENT & PLAN:    1.  Aortic insufficiency -mild by echo 2019 -repeat echo to make sure this is stable  2.  Hx of MVP -no evidence of MVP on echo 07/2018  3.  HTN -BP controlled on exam today -she has had some dizzy spells when going from bending over to standing up but orthostatics are normal in office today -diet controlled  4.  Exertional weakness -I am concerned that she has been having problems with exertional fatigue and then breaks out in a cold sweat on her walks -I will get a Lexiscan myoview to rule out ischemia -risks and benefits of nuclear stress test were discussed today in the office including risk of MI, arrhythmia, CVA and death.  Patient understands and wishes to proceed.  .  Medication Adjustments/Labs and Tests Ordered: Current medicines are reviewed at length with the patient today.  Concerns regarding medicines are outlined above.  Tests Ordered: Orders Placed This Encounter  Procedures  . EKG 12-Lead   Medication Changes: No orders of the defined types were placed in this encounter.   Disposition:  Follow up in 1 year(s)  Signed, Fransico Him, MD  07/20/2020 8:34 AM    [facility]

## 2020-07-20 NOTE — Patient Instructions (Addendum)
Medication Instructions:  Your physician recommends that you continue on your current medications as directed. Please refer to the Current Medication list given to you today.  *If you need a refill on your cardiac medications before your next appointment, please call your pharmacy*  Testing/Procedures: Your physician has requested that you have a lexiscan myoview. For further information please visit [email or web address]. Please follow instruction sheet, as given.  Follow-Up: At [facility], you and your health needs are our priority.  As part of our continuing mission to provide you with exceptional heart care, we have created designated Provider Care Teams.  These Care Teams include your primary Cardiologist (physician) and Advanced Practice Providers (APPs -  Physician Assistants and Nurse Practitioners) who all work together to provide you with the care you need, when you need it.  Your next appointment:   1 year(s)  The format for your next appointment:   In Person  Provider:   You may see Traci Turner, MD or one of the following Advanced Practice Providers on your designated Care Team:    Dayna Dunn, PA-C  Michele Lenze, PA-C   

## 2020-07-21 LAB — ECHOCARDIOGRAM COMPLETE
Area-P 1/2: 2.83 cm2
Height: 63 in
P 1/2 time: 501 msec
S' Lateral: 2.2 cm
Weight: 1552 oz

## 2020-07-22 ENCOUNTER — Telehealth (HOSPITAL_COMMUNITY): Payer: Self-pay | Admitting: *Deleted

## 2020-07-22 NOTE — Telephone Encounter (Signed)
Patient given detailed instructions per Myocardial Perfusion Study Information Sheet for the test on 07/27/20 at 7:45. Patient notified to arrive 15 minutes early and that it is imperative to arrive on time for appointment to keep from having the test rescheduled.  If you need to cancel or reschedule your appointment, please call the office within 24 hours of your appointment. . Patient verbalized understanding.Kelly Grimes

## 2020-07-23 ENCOUNTER — Telehealth (HOSPITAL_COMMUNITY): Payer: Self-pay | Admitting: *Deleted

## 2020-07-23 ENCOUNTER — Encounter: Payer: Self-pay | Admitting: Cardiology

## 2020-07-23 ENCOUNTER — Telehealth: Payer: Self-pay | Admitting: Cardiology

## 2020-07-23 NOTE — Telephone Encounter (Signed)
Patient is returning Carol's call. Please advise.

## 2020-07-23 NOTE — Telephone Encounter (Signed)
See echo results for notes.

## 2020-07-24 NOTE — Telephone Encounter (Signed)
err

## 2020-07-27 ENCOUNTER — Other Ambulatory Visit: Payer: Self-pay

## 2020-07-27 ENCOUNTER — Ambulatory Visit (HOSPITAL_COMMUNITY): Payer: Medicare Other | Attending: Cardiology

## 2020-07-27 DIAGNOSIS — T733XXA Exhaustion due to excessive exertion, initial encounter: Secondary | ICD-10-CM | POA: Diagnosis not present

## 2020-07-27 DIAGNOSIS — I341 Nonrheumatic mitral (valve) prolapse: Secondary | ICD-10-CM

## 2020-07-27 DIAGNOSIS — R0602 Shortness of breath: Secondary | ICD-10-CM | POA: Diagnosis not present

## 2020-07-27 DIAGNOSIS — R079 Chest pain, unspecified: Secondary | ICD-10-CM | POA: Diagnosis not present

## 2020-07-27 DIAGNOSIS — X58XXXA Exposure to other specified factors, initial encounter: Secondary | ICD-10-CM | POA: Insufficient documentation

## 2020-07-27 DIAGNOSIS — I351 Nonrheumatic aortic (valve) insufficiency: Secondary | ICD-10-CM | POA: Diagnosis not present

## 2020-07-27 LAB — MYOCARDIAL PERFUSION IMAGING
LV dias vol: 59 mL (ref 46–106)
LV sys vol: 16 mL
Peak HR: 76 {beats}/min
Rest HR: 62 {beats}/min
SDS: 2
SRS: 1
SSS: 3
TID: 0.9

## 2020-07-27 MED ORDER — TECHNETIUM TC 99M TETROFOSMIN IV KIT
32.6000 | PACK | Freq: Once | INTRAVENOUS | Status: AC | PRN
  Administered 2020-07-27: 32.6 via INTRAVENOUS
  Filled 2020-07-27: qty 33

## 2020-07-27 MED ORDER — REGADENOSON 0.4 MG/5ML IV SOLN
0.4000 mg | Freq: Once | INTRAVENOUS | Status: AC
  Administered 2020-07-27: 0.4 mg via INTRAVENOUS

## 2020-07-27 MED ORDER — TECHNETIUM TC 99M TETROFOSMIN IV KIT
10.8000 | PACK | Freq: Once | INTRAVENOUS | Status: AC | PRN
  Administered 2020-07-27: 10.8 via INTRAVENOUS
  Filled 2020-07-27: qty 11

## 2020-08-21 DIAGNOSIS — H401132 Primary open-angle glaucoma, bilateral, moderate stage: Secondary | ICD-10-CM | POA: Diagnosis not present

## 2020-09-03 ENCOUNTER — Telehealth: Payer: Self-pay | Admitting: Cardiology

## 2020-09-03 NOTE — Telephone Encounter (Signed)
Patient is requesting to discuss her stress test results.

## 2020-09-03 NOTE — Telephone Encounter (Signed)
Patient called to discuss stress test results. Plan and recommendations reviewed. Patient had no further questions. Encouraged patient to reach out with any questions.

## 2020-10-27 DIAGNOSIS — L820 Inflamed seborrheic keratosis: Secondary | ICD-10-CM | POA: Diagnosis not present

## 2020-10-27 DIAGNOSIS — R208 Other disturbances of skin sensation: Secondary | ICD-10-CM | POA: Diagnosis not present

## 2020-10-27 DIAGNOSIS — L720 Epidermal cyst: Secondary | ICD-10-CM | POA: Diagnosis not present

## 2020-10-30 DIAGNOSIS — S52502A Unspecified fracture of the lower end of left radius, initial encounter for closed fracture: Secondary | ICD-10-CM | POA: Diagnosis not present

## 2020-11-02 DIAGNOSIS — S52502A Unspecified fracture of the lower end of left radius, initial encounter for closed fracture: Secondary | ICD-10-CM | POA: Diagnosis not present

## 2020-11-02 DIAGNOSIS — R52 Pain, unspecified: Secondary | ICD-10-CM | POA: Diagnosis not present

## 2020-11-03 DIAGNOSIS — S52572A Other intraarticular fracture of lower end of left radius, initial encounter for closed fracture: Secondary | ICD-10-CM | POA: Diagnosis not present

## 2020-11-03 DIAGNOSIS — G8918 Other acute postprocedural pain: Secondary | ICD-10-CM | POA: Diagnosis not present

## 2020-11-10 DIAGNOSIS — Z029 Encounter for administrative examinations, unspecified: Secondary | ICD-10-CM | POA: Diagnosis not present

## 2020-11-10 DIAGNOSIS — M8000XD Age-related osteoporosis with current pathological fracture, unspecified site, subsequent encounter for fracture with routine healing: Secondary | ICD-10-CM | POA: Diagnosis not present

## 2020-11-10 DIAGNOSIS — Z681 Body mass index (BMI) 19 or less, adult: Secondary | ICD-10-CM | POA: Diagnosis not present

## 2020-11-10 DIAGNOSIS — S62102D Fracture of unspecified carpal bone, left wrist, subsequent encounter for fracture with routine healing: Secondary | ICD-10-CM | POA: Diagnosis not present

## 2020-11-11 DIAGNOSIS — Z4789 Encounter for other orthopedic aftercare: Secondary | ICD-10-CM | POA: Diagnosis not present

## 2020-11-11 DIAGNOSIS — M25532 Pain in left wrist: Secondary | ICD-10-CM | POA: Diagnosis not present

## 2020-11-11 DIAGNOSIS — M25561 Pain in right knee: Secondary | ICD-10-CM | POA: Diagnosis not present

## 2020-11-11 DIAGNOSIS — S52502D Unspecified fracture of the lower end of left radius, subsequent encounter for closed fracture with routine healing: Secondary | ICD-10-CM | POA: Diagnosis not present

## 2020-12-02 DIAGNOSIS — M25552 Pain in left hip: Secondary | ICD-10-CM | POA: Diagnosis not present

## 2020-12-02 DIAGNOSIS — M25632 Stiffness of left wrist, not elsewhere classified: Secondary | ICD-10-CM | POA: Diagnosis not present

## 2020-12-02 DIAGNOSIS — S52502D Unspecified fracture of the lower end of left radius, subsequent encounter for closed fracture with routine healing: Secondary | ICD-10-CM | POA: Diagnosis not present

## 2020-12-02 DIAGNOSIS — Z4789 Encounter for other orthopedic aftercare: Secondary | ICD-10-CM | POA: Diagnosis not present

## 2020-12-02 DIAGNOSIS — R1032 Left lower quadrant pain: Secondary | ICD-10-CM | POA: Diagnosis not present

## 2020-12-11 DIAGNOSIS — M25632 Stiffness of left wrist, not elsewhere classified: Secondary | ICD-10-CM | POA: Diagnosis not present

## 2020-12-16 DIAGNOSIS — H401132 Primary open-angle glaucoma, bilateral, moderate stage: Secondary | ICD-10-CM | POA: Diagnosis not present

## 2020-12-21 DIAGNOSIS — M25632 Stiffness of left wrist, not elsewhere classified: Secondary | ICD-10-CM | POA: Diagnosis not present

## 2020-12-28 DIAGNOSIS — M25632 Stiffness of left wrist, not elsewhere classified: Secondary | ICD-10-CM | POA: Diagnosis not present

## 2020-12-29 DIAGNOSIS — Z01419 Encounter for gynecological examination (general) (routine) without abnormal findings: Secondary | ICD-10-CM | POA: Diagnosis not present

## 2020-12-29 DIAGNOSIS — Z681 Body mass index (BMI) 19 or less, adult: Secondary | ICD-10-CM | POA: Diagnosis not present

## 2020-12-29 DIAGNOSIS — M8588 Other specified disorders of bone density and structure, other site: Secondary | ICD-10-CM | POA: Diagnosis not present

## 2020-12-29 DIAGNOSIS — Z1231 Encounter for screening mammogram for malignant neoplasm of breast: Secondary | ICD-10-CM | POA: Diagnosis not present

## 2020-12-30 DIAGNOSIS — Z4789 Encounter for other orthopedic aftercare: Secondary | ICD-10-CM | POA: Diagnosis not present

## 2020-12-30 DIAGNOSIS — S52502D Unspecified fracture of the lower end of left radius, subsequent encounter for closed fracture with routine healing: Secondary | ICD-10-CM | POA: Diagnosis not present

## 2021-01-05 ENCOUNTER — Other Ambulatory Visit: Payer: Self-pay | Admitting: Obstetrics and Gynecology

## 2021-01-05 DIAGNOSIS — R928 Other abnormal and inconclusive findings on diagnostic imaging of breast: Secondary | ICD-10-CM

## 2021-01-07 DIAGNOSIS — M25632 Stiffness of left wrist, not elsewhere classified: Secondary | ICD-10-CM | POA: Diagnosis not present

## 2021-01-20 ENCOUNTER — Other Ambulatory Visit: Payer: Self-pay

## 2021-01-20 ENCOUNTER — Ambulatory Visit
Admission: RE | Admit: 2021-01-20 | Discharge: 2021-01-20 | Disposition: A | Discharge status: Home / Self Care | Payer: Medicare Other | Source: Ambulatory Visit | Attending: Obstetrics and Gynecology | Admitting: Obstetrics and Gynecology

## 2021-01-20 DIAGNOSIS — R928 Other abnormal and inconclusive findings on diagnostic imaging of breast: Secondary | ICD-10-CM

## 2021-01-20 DIAGNOSIS — R922 Inconclusive mammogram: Secondary | ICD-10-CM | POA: Diagnosis not present

## 2021-03-03 ENCOUNTER — Telehealth: Payer: Self-pay | Admitting: Cardiology

## 2021-03-03 DIAGNOSIS — I341 Nonrheumatic mitral (valve) prolapse: Secondary | ICD-10-CM

## 2021-03-03 DIAGNOSIS — I351 Nonrheumatic aortic (valve) insufficiency: Secondary | ICD-10-CM

## 2021-03-03 NOTE — Telephone Encounter (Signed)
   Kelly Margarita, MD  07/23/2020 12:26 PM EST      Echo showed normal heart function with mildly leaky MV and moderately thickened and calcified AV with moderate leakiness of AV which is stable. Repeat echo in1 year   Per results from last echocardiogram patient will need to repeat in December 2022.  Patient has been scheduled for an echocardiogram on the same day as her appointment

## 2021-03-03 NOTE — Telephone Encounter (Signed)
Kelly Grimes is calling requesting an order be placed for her yearly Echo. She is wanting to do it the same day as her yearly f/u. She would like a callback once the order is placed to setup the ultrasound. Please advise.

## 2021-03-04 DIAGNOSIS — L905 Scar conditions and fibrosis of skin: Secondary | ICD-10-CM | POA: Diagnosis not present

## 2021-03-04 DIAGNOSIS — D225 Melanocytic nevi of trunk: Secondary | ICD-10-CM | POA: Diagnosis not present

## 2021-03-04 DIAGNOSIS — L814 Other melanin hyperpigmentation: Secondary | ICD-10-CM | POA: Diagnosis not present

## 2021-03-04 DIAGNOSIS — D1801 Hemangioma of skin and subcutaneous tissue: Secondary | ICD-10-CM | POA: Diagnosis not present

## 2021-04-06 DIAGNOSIS — H409 Unspecified glaucoma: Secondary | ICD-10-CM | POA: Diagnosis not present

## 2021-04-06 DIAGNOSIS — Z1389 Encounter for screening for other disorder: Secondary | ICD-10-CM | POA: Diagnosis not present

## 2021-04-06 DIAGNOSIS — R636 Underweight: Secondary | ICD-10-CM | POA: Diagnosis not present

## 2021-04-06 DIAGNOSIS — Z0000 Encounter for general adult medical examination without abnormal findings: Secondary | ICD-10-CM | POA: Diagnosis not present

## 2021-04-06 DIAGNOSIS — I700 Atherosclerosis of aorta: Secondary | ICD-10-CM | POA: Diagnosis not present

## 2021-04-06 DIAGNOSIS — M8000XD Age-related osteoporosis with current pathological fracture, unspecified site, subsequent encounter for fracture with routine healing: Secondary | ICD-10-CM | POA: Diagnosis not present

## 2021-04-28 DIAGNOSIS — H401132 Primary open-angle glaucoma, bilateral, moderate stage: Secondary | ICD-10-CM | POA: Diagnosis not present

## 2021-07-21 ENCOUNTER — Ambulatory Visit: Payer: Medicare Other | Admitting: Cardiology

## 2021-07-21 ENCOUNTER — Other Ambulatory Visit (HOSPITAL_COMMUNITY): Payer: Medicare Other

## 2021-08-10 DIAGNOSIS — H401132 Primary open-angle glaucoma, bilateral, moderate stage: Secondary | ICD-10-CM | POA: Diagnosis not present

## 2021-09-05 DIAGNOSIS — I773 Arterial fibromuscular dysplasia: Secondary | ICD-10-CM | POA: Diagnosis not present

## 2021-09-05 DIAGNOSIS — R42 Dizziness and giddiness: Secondary | ICD-10-CM | POA: Diagnosis not present

## 2021-09-05 DIAGNOSIS — I6523 Occlusion and stenosis of bilateral carotid arteries: Secondary | ICD-10-CM | POA: Diagnosis not present

## 2021-09-05 DIAGNOSIS — R9431 Abnormal electrocardiogram [ECG] [EKG]: Secondary | ICD-10-CM | POA: Diagnosis not present

## 2021-09-05 DIAGNOSIS — R519 Headache, unspecified: Secondary | ICD-10-CM | POA: Diagnosis not present

## 2021-10-20 DIAGNOSIS — H409 Unspecified glaucoma: Secondary | ICD-10-CM | POA: Diagnosis not present

## 2021-10-20 DIAGNOSIS — E7800 Pure hypercholesterolemia, unspecified: Secondary | ICD-10-CM | POA: Diagnosis not present

## 2021-10-20 DIAGNOSIS — I351 Nonrheumatic aortic (valve) insufficiency: Secondary | ICD-10-CM | POA: Diagnosis not present

## 2021-10-20 DIAGNOSIS — I341 Nonrheumatic mitral (valve) prolapse: Secondary | ICD-10-CM | POA: Diagnosis not present

## 2021-11-10 ENCOUNTER — Ambulatory Visit: Payer: Medicare Other | Admitting: Cardiology

## 2021-11-10 ENCOUNTER — Telehealth: Payer: Self-pay

## 2021-11-10 ENCOUNTER — Encounter: Payer: Self-pay | Admitting: Cardiology

## 2021-11-10 ENCOUNTER — Ambulatory Visit (HOSPITAL_COMMUNITY): Payer: Medicare Other | Attending: Cardiovascular Disease

## 2021-11-10 VITALS — BP 116/68 | HR 68 | Ht 63.0 in | Wt 99.0 lb

## 2021-11-10 DIAGNOSIS — I341 Nonrheumatic mitral (valve) prolapse: Secondary | ICD-10-CM | POA: Insufficient documentation

## 2021-11-10 DIAGNOSIS — I10 Essential (primary) hypertension: Secondary | ICD-10-CM

## 2021-11-10 DIAGNOSIS — I351 Nonrheumatic aortic (valve) insufficiency: Secondary | ICD-10-CM

## 2021-11-10 LAB — ECHOCARDIOGRAM COMPLETE
Area-P 1/2: 3.17 cm2
Height: 63 in
P 1/2 time: 516 msec
S' Lateral: 2.1 cm
Weight: 1584 oz

## 2021-11-10 NOTE — Telephone Encounter (Signed)
-----   Message from Sueanne Margarita, MD sent at 11/10/2021  1:39 PM EDT ----- ?Echo showed normal LV function with EF 60 to 65% with mild LVH.  The right atrium was mildly dilated.  There was no evidence of mitral valve prolapse but there was some thickening of the mitral valve which appeared myxomatous and typically is seen in the setting of mitral valve prolapse and some mild mitral regurgitation.  He continues to have aortic insufficiency that is moderate and stable.  Repeat 2D echocardiogram in 1 year for AI. ?

## 2021-11-10 NOTE — Progress Notes (Signed)
?Date:  11/10/2021  ? ?ID:  Kelly Grimes, DOB Jun 08, 1941, MRN [id number] ? ?PCP:  Javier Glazier, MD  ?Cardiologist:  Fransico Him, MD  ?Electrophysiologist:  None  ? ?Chief Complaint:  Aortic insufficiency ? ?History of Present Illness:   ? ?Kelly Grimes is a 81 y.o. female with a hx of mild to mdoerate AR.  Her last echo 2019 showing normal LVF with EF 60-65% with mild LVH and mild AR.  She is here today for followup and is doing well.  She denies any chest pain or pressure, SOB, DOE, PND, orthopnea, LE edema, dizziness (except for vertigo), palpitations or syncope. She is compliant with her meds and is tolerating meds with no SE.    ? ?Prior CV studies:   ?The following studies were reviewed today: ? ?2D echo 2019 ? ?Past Medical History:  ?Diagnosis Date  ? Aortic insufficiency 07/2010  ? moderate by echo 07/2020  ? Arthritis   ? Bruises easily   ? Glaucoma   ? Pre glaucoma Dr. Ellie Lunch / "borderline"  ? Headache   ? Dr. Krista Blue  ? Hyperlipidemia   ? Melanoma ([place]) 1993  ? Left arm Dr Allyson Sabal  ? MVP (mitral valve prolapse) 12/11/2015  ? Osteopenia   ? ?Past Surgical History:  ?Procedure Laterality Date  ? EYE SURGERY    ? od cataract  ? HERNIA REPAIR    ? right inguinal/femoral  ? JOINT REPLACEMENT    ? hip  ? OPEN REDUCTION INTERNAL FIXATION (ORIF) DISTAL RADIAL FRACTURE Right 11/16/2013  ? Procedure: OPEN REDUCTION INTERNAL FIXATION (ORIF) RIGHT DISTAL RADIUS FRACTURE WITH REPAIR AS NEEDED AND ALLOGRAFT BONE GRAFT;  Surgeon: Roseanne Kaufman, MD;  Location: [place] ;  Service: Orthopedics;  Laterality: Right;  ? TOTAL KNEE ARTHROPLASTY Right 03/24/2015  ? Procedure: RIGHT TOTAL KNEE ARTHROPLASTY;  Surgeon: Latanya Maudlin, MD;  Location: [facility];  Service: Orthopedics;  Laterality: Right;  ?  ? ?Current Meds  ?Medication Sig  ? Ascorbic Acid (VITAMIN C) 100 MG tablet Take 100 mg by mouth daily.  ? bimatoprost (LUMIGAN) 0.03 % ophthalmic solution Place 1 drop into both eyes nightly.  ? calcium citrate (CALCITRATE -  DOSED IN MG ELEMENTAL CALCIUM) 950 (200 Ca) MG tablet Take 200 mg of elemental calcium by mouth daily.  ? Cholecalciferol (VITAMIN D3) 250 MCG (10000 UT) capsule Take 10,000 Units by mouth once a week.  ? clobetasol ointment (TEMOVATE) 4.09 % Apply 1 application topically daily as needed (skin).   ? Omega-3 Fatty Acids (FISH OIL) 1000 MG CAPS Take by mouth.  ? Pyridoxine HCl (VITAMIN B6) 250 MG TABS Take 250 mg by mouth daily.  ?  ? ?Allergies:   Patient has no known allergies.  ? ?Social History  ? ?Tobacco Use  ? Smoking status: Never  ? Smokeless tobacco: Never  ?Substance Use Topics  ? Alcohol use: No  ? Drug use: No  ?  ? ?Family Hx: ?The patient's family history includes Alzheimer's disease in her mother; Pancreatic cancer in her father. There is no history of Breast cancer. ? ?ROS:   ?Please see the history of present illness.    ? ?All other systems reviewed and are negative. ? ? ?Labs/Other Tests and Data Reviewed:   ? ?Recent Labs: ?No results found for requested labs within last 8760 hours.  ? ?Recent Lipid Panel ?No results found for: CHOL, TRIG, HDL, CHOLHDL, LDLCALC, LDLDIRECT ? ?Wt Readings from Last 3 Encounters:  ?11/10/21 99 lb (44.9  kg)  ?07/27/20 97 lb (44 kg)  ?07/20/20 97 lb (44 kg)  ?  ?EKG was performed today and showed NSR with PACs ? ?Objective:   ? ?Vital Signs:  BP 116/68   Pulse 68   Ht '5\' 3"'$  (1.6 m)   Wt 99 lb (44.9 kg)   SpO2 97%   BMI 17.54 kg/m?   ?GEN: Well nourished, well developed in no acute distress ?HEENT: Normal ?NECK: No JVD; No carotid bruits ?LYMPHATICS: No lymphadenopathy ?CARDIAC:RRR, no murmurs, rubs, gallops ?RESPIRATORY:  Clear to auscultation without rales, wheezing or rhonchi  ?ABDOMEN: Soft, non-tender, non-distended ?MUSCULOSKELETAL:  No edema; No deformity  ?SKIN: Warm and dry ?NEUROLOGIC:  Alert and oriented x 3 ?PSYCHIATRIC:  Normal affect   ? ?ASSESSMENT & PLAN:   ? ?1.  Aortic insufficiency ?-Moderate by echo 07/2020 ?-Repeat 2D echo to make sure this is  stable ? ?2.  Hx of MVP ?-no evidence of MVP on echo 07/2018 ?-Mild MR on echo 07/27/2020 ? ?3.  HTN ?-BP is well controlled on exam today ?-diet controlled ? ?4.  Exertional weakness ?-Lexiscan Myoview showed no ischemia 07/2020 ?-This is resolved ? ?Medication Adjustments/Labs and Tests Ordered: ?Current medicines are reviewed at length with the patient today.  Concerns regarding medicines are outlined above.  ?Tests Ordered: ?Orders Placed This Encounter  ?Procedures  ? EKG 12-Lead  ? ?Medication Changes: ?No orders of the defined types were placed in this encounter. ? ? ?Disposition:  Follow up in 1 year(s) ? ?Signed, ?Fransico Him, MD  ?11/10/2021 11:01 AM    ?[place] ?

## 2021-11-10 NOTE — Patient Instructions (Signed)

## 2021-11-10 NOTE — Telephone Encounter (Signed)
The patient has been notified of the result and verbalized understanding.  All questions (if any) were answered. ?Antonieta Iba, RN 11/10/2021 4:14 PM  ? ?

## 2021-12-30 DIAGNOSIS — Z1231 Encounter for screening mammogram for malignant neoplasm of breast: Secondary | ICD-10-CM | POA: Diagnosis not present

## 2021-12-30 DIAGNOSIS — Z124 Encounter for screening for malignant neoplasm of cervix: Secondary | ICD-10-CM | POA: Diagnosis not present

## 2021-12-30 DIAGNOSIS — Z681 Body mass index (BMI) 19 or less, adult: Secondary | ICD-10-CM | POA: Diagnosis not present

## 2021-12-30 DIAGNOSIS — Z01419 Encounter for gynecological examination (general) (routine) without abnormal findings: Secondary | ICD-10-CM | POA: Diagnosis not present

## 2022-01-27 DIAGNOSIS — E785 Hyperlipidemia, unspecified: Secondary | ICD-10-CM | POA: Diagnosis not present

## 2022-01-27 DIAGNOSIS — M8580 Other specified disorders of bone density and structure, unspecified site: Secondary | ICD-10-CM | POA: Diagnosis not present

## 2022-02-02 DIAGNOSIS — R519 Headache, unspecified: Secondary | ICD-10-CM | POA: Diagnosis not present

## 2022-02-02 DIAGNOSIS — I351 Nonrheumatic aortic (valve) insufficiency: Secondary | ICD-10-CM | POA: Diagnosis not present

## 2022-02-02 DIAGNOSIS — M8589 Other specified disorders of bone density and structure, multiple sites: Secondary | ICD-10-CM | POA: Diagnosis not present

## 2022-02-02 DIAGNOSIS — Z0000 Encounter for general adult medical examination without abnormal findings: Secondary | ICD-10-CM | POA: Diagnosis not present

## 2022-02-09 DIAGNOSIS — H401122 Primary open-angle glaucoma, left eye, moderate stage: Secondary | ICD-10-CM | POA: Diagnosis not present

## 2022-02-09 DIAGNOSIS — H5213 Myopia, bilateral: Secondary | ICD-10-CM | POA: Diagnosis not present

## 2022-02-09 DIAGNOSIS — H401112 Primary open-angle glaucoma, right eye, moderate stage: Secondary | ICD-10-CM | POA: Diagnosis not present

## 2022-02-09 DIAGNOSIS — H43811 Vitreous degeneration, right eye: Secondary | ICD-10-CM | POA: Diagnosis not present

## 2022-02-24 DIAGNOSIS — L570 Actinic keratosis: Secondary | ICD-10-CM | POA: Diagnosis not present

## 2022-02-24 DIAGNOSIS — L814 Other melanin hyperpigmentation: Secondary | ICD-10-CM | POA: Diagnosis not present

## 2022-02-24 DIAGNOSIS — L821 Other seborrheic keratosis: Secondary | ICD-10-CM | POA: Diagnosis not present

## 2022-02-24 DIAGNOSIS — D225 Melanocytic nevi of trunk: Secondary | ICD-10-CM | POA: Diagnosis not present

## 2022-02-24 DIAGNOSIS — D485 Neoplasm of uncertain behavior of skin: Secondary | ICD-10-CM | POA: Diagnosis not present

## 2022-03-31 DIAGNOSIS — D049 Carcinoma in situ of skin, unspecified: Secondary | ICD-10-CM | POA: Diagnosis not present

## 2022-03-31 DIAGNOSIS — D485 Neoplasm of uncertain behavior of skin: Secondary | ICD-10-CM | POA: Diagnosis not present

## 2022-04-06 DIAGNOSIS — H35373 Puckering of macula, bilateral: Secondary | ICD-10-CM | POA: Diagnosis not present

## 2022-04-06 DIAGNOSIS — H5000 Unspecified esotropia: Secondary | ICD-10-CM | POA: Diagnosis not present

## 2022-04-06 DIAGNOSIS — Z961 Presence of intraocular lens: Secondary | ICD-10-CM | POA: Diagnosis not present

## 2022-04-06 DIAGNOSIS — H5213 Myopia, bilateral: Secondary | ICD-10-CM | POA: Diagnosis not present

## 2022-04-25 DIAGNOSIS — Z09 Encounter for follow-up examination after completed treatment for conditions other than malignant neoplasm: Secondary | ICD-10-CM | POA: Diagnosis not present

## 2022-04-25 DIAGNOSIS — D049 Carcinoma in situ of skin, unspecified: Secondary | ICD-10-CM | POA: Diagnosis not present

## 2022-04-25 DIAGNOSIS — L244 Irritant contact dermatitis due to drugs in contact with skin: Secondary | ICD-10-CM | POA: Diagnosis not present

## 2022-04-25 DIAGNOSIS — L820 Inflamed seborrheic keratosis: Secondary | ICD-10-CM | POA: Diagnosis not present

## 2022-05-03 DIAGNOSIS — H401132 Primary open-angle glaucoma, bilateral, moderate stage: Secondary | ICD-10-CM | POA: Diagnosis not present

## 2022-08-17 DIAGNOSIS — G8929 Other chronic pain: Secondary | ICD-10-CM | POA: Diagnosis not present

## 2022-08-17 DIAGNOSIS — R519 Headache, unspecified: Secondary | ICD-10-CM | POA: Diagnosis not present

## 2022-08-23 DIAGNOSIS — H401132 Primary open-angle glaucoma, bilateral, moderate stage: Secondary | ICD-10-CM | POA: Diagnosis not present

## 2022-10-05 DIAGNOSIS — G43909 Migraine, unspecified, not intractable, without status migrainosus: Secondary | ICD-10-CM | POA: Diagnosis not present

## 2022-10-05 DIAGNOSIS — M436 Torticollis: Secondary | ICD-10-CM | POA: Diagnosis not present

## 2022-10-05 DIAGNOSIS — M542 Cervicalgia: Secondary | ICD-10-CM | POA: Diagnosis not present

## 2022-10-17 DIAGNOSIS — R52 Pain, unspecified: Secondary | ICD-10-CM | POA: Diagnosis not present

## 2022-10-17 DIAGNOSIS — M19042 Primary osteoarthritis, left hand: Secondary | ICD-10-CM | POA: Diagnosis not present

## 2022-10-17 DIAGNOSIS — M13842 Other specified arthritis, left hand: Secondary | ICD-10-CM | POA: Diagnosis not present

## 2022-10-17 DIAGNOSIS — M79642 Pain in left hand: Secondary | ICD-10-CM | POA: Diagnosis not present

## 2022-10-18 DIAGNOSIS — M5031 Other cervical disc degeneration,  high cervical region: Secondary | ICD-10-CM | POA: Diagnosis not present

## 2022-10-18 DIAGNOSIS — M9901 Segmental and somatic dysfunction of cervical region: Secondary | ICD-10-CM | POA: Diagnosis not present

## 2022-10-20 DIAGNOSIS — M5031 Other cervical disc degeneration,  high cervical region: Secondary | ICD-10-CM | POA: Diagnosis not present

## 2022-10-20 DIAGNOSIS — M9901 Segmental and somatic dysfunction of cervical region: Secondary | ICD-10-CM | POA: Diagnosis not present

## 2022-10-25 DIAGNOSIS — M5031 Other cervical disc degeneration,  high cervical region: Secondary | ICD-10-CM | POA: Diagnosis not present

## 2022-10-25 DIAGNOSIS — M9901 Segmental and somatic dysfunction of cervical region: Secondary | ICD-10-CM | POA: Diagnosis not present

## 2022-11-08 ENCOUNTER — Encounter: Payer: Self-pay | Admitting: Cardiology

## 2022-11-08 ENCOUNTER — Telehealth: Payer: Self-pay

## 2022-11-08 ENCOUNTER — Ambulatory Visit (HOSPITAL_COMMUNITY): Payer: Medicare Other | Attending: Cardiology

## 2022-11-08 DIAGNOSIS — I341 Nonrheumatic mitral (valve) prolapse: Secondary | ICD-10-CM | POA: Diagnosis not present

## 2022-11-08 DIAGNOSIS — I351 Nonrheumatic aortic (valve) insufficiency: Secondary | ICD-10-CM | POA: Insufficient documentation

## 2022-11-08 LAB — ECHOCARDIOGRAM COMPLETE
Area-P 1/2: 3.85 cm2
P 1/2 time: 444 msec
S' Lateral: 2.5 cm

## 2022-11-08 NOTE — Telephone Encounter (Signed)
-----   Message from Sueanne Margarita, MD sent at 11/08/2022  2:12 PM EDT ----- 2D echo showed normal heart function with moderate stable leakiness of AV.  Repeat echo in 1 year for AI

## 2022-11-08 NOTE — Telephone Encounter (Signed)
Left voicemail to return call to office.

## 2022-11-11 DIAGNOSIS — M5412 Radiculopathy, cervical region: Secondary | ICD-10-CM | POA: Diagnosis not present

## 2022-11-14 DIAGNOSIS — M5412 Radiculopathy, cervical region: Secondary | ICD-10-CM | POA: Diagnosis not present

## 2022-11-17 DIAGNOSIS — K080 Exfoliation of teeth due to systemic causes: Secondary | ICD-10-CM | POA: Diagnosis not present

## 2022-11-18 DIAGNOSIS — M5412 Radiculopathy, cervical region: Secondary | ICD-10-CM | POA: Diagnosis not present

## 2022-11-23 DIAGNOSIS — M5412 Radiculopathy, cervical region: Secondary | ICD-10-CM | POA: Diagnosis not present

## 2022-11-25 DIAGNOSIS — M5412 Radiculopathy, cervical region: Secondary | ICD-10-CM | POA: Diagnosis not present

## 2022-11-30 DIAGNOSIS — K080 Exfoliation of teeth due to systemic causes: Secondary | ICD-10-CM | POA: Diagnosis not present

## 2022-11-30 DIAGNOSIS — M5412 Radiculopathy, cervical region: Secondary | ICD-10-CM | POA: Diagnosis not present

## 2022-12-06 DIAGNOSIS — H401121 Primary open-angle glaucoma, left eye, mild stage: Secondary | ICD-10-CM | POA: Diagnosis not present

## 2022-12-06 DIAGNOSIS — H401113 Primary open-angle glaucoma, right eye, severe stage: Secondary | ICD-10-CM | POA: Diagnosis not present

## 2022-12-15 DIAGNOSIS — H903 Sensorineural hearing loss, bilateral: Secondary | ICD-10-CM | POA: Diagnosis not present

## 2022-12-19 DIAGNOSIS — K080 Exfoliation of teeth due to systemic causes: Secondary | ICD-10-CM | POA: Diagnosis not present

## 2022-12-21 ENCOUNTER — Encounter: Payer: Self-pay | Admitting: Cardiology

## 2022-12-21 ENCOUNTER — Ambulatory Visit: Payer: Medicare Other | Attending: Cardiology | Admitting: Cardiology

## 2022-12-21 VITALS — BP 120/82 | HR 54 | Ht 63.0 in | Wt 99.8 lb

## 2022-12-21 DIAGNOSIS — I351 Nonrheumatic aortic (valve) insufficiency: Secondary | ICD-10-CM | POA: Diagnosis not present

## 2022-12-21 DIAGNOSIS — I10 Essential (primary) hypertension: Secondary | ICD-10-CM | POA: Diagnosis not present

## 2022-12-21 DIAGNOSIS — I341 Nonrheumatic mitral (valve) prolapse: Secondary | ICD-10-CM | POA: Diagnosis not present

## 2022-12-21 NOTE — Addendum Note (Signed)
Addended by: Luellen Pucker on: 12/21/2022 10:17 AM   Modules accepted: Orders

## 2022-12-21 NOTE — Patient Instructions (Signed)
Medication Instructions:  Your physician recommends that you continue on your current medications as directed. Please refer to the Current Medication list given to you today.  *If you need a refill on your cardiac medications before your next appointment, please call your pharmacy*   Lab Work: None.  If you have labs (blood work) drawn today and your tests are completely normal, you will receive your results only by: MyChart Message (if you have MyChart) OR A paper copy in the mail If you have any lab test that is abnormal or we need to change your treatment, we will call you to review the results.   Testing/Procedures: Your physician has requested that you have an echocardiogram in one year. Please schedule to have this repeat Echo done prior to your one year follow up visit.  Echocardiography is a painless test that uses sound waves to create images of your heart. It provides your doctor with information about the size and shape of your heart and how well your heart's chambers and valves are working. This procedure takes approximately one hour. There are no restrictions for this procedure. Please do NOT wear cologne, perfume, aftershave, or lotions (deodorant is allowed). Please arrive 15 minutes prior to your appointment time.    Follow-Up: At [facility], you and your health needs are our priority.  As part of our continuing mission to provide you with exceptional heart care, we have created designated Provider Care Teams.  These Care Teams include your primary Cardiologist (physician) and Advanced Practice Providers (APPs -  Physician Assistants and Nurse Practitioners) who all work together to provide you with the care you need, when you need it.  We recommend signing up for the patient portal called "MyChart".  Sign up information is provided on this After Visit Summary.  MyChart is used to connect with patients for Virtual Visits (Telemedicine).  Patients are able to view  lab/test results, encounter notes, upcoming appointments, etc.  Non-urgent messages can be sent to your provider as well.   To learn more about what you can do with MyChart, go to [email or web address].    Your next appointment:   1 year(s)  Provider:   Armanda Magic, MD

## 2022-12-21 NOTE — Progress Notes (Signed)
Date:  12/21/2022   ID:  Kelly Grimes, DOB May 09, 1941, MRN [id number]  PCP:  Nadara Eaton, MD  Cardiologist:  Armanda Magic, MD  Electrophysiologist:  None   Chief Complaint:  Aortic insufficiency  History of Present Illness:    Kelly Grimes is a 82 y.o. female with a hx of mild to mdoerate AR.  Her last echo 11/26/2022 showed EF 65 to 70% with myxomatous mitral valve with no MVP and trivial MR, moderate AR.  She is here today for followup and is doing well.  She denies any chest pain or pressure, SOB, DOE, PND, orthopnea, LE edema, dizziness, palpitations or syncope. She is compliant with her meds and is tolerating meds with no SE.    Prior CV studies:   The following studies were reviewed today:  2D echo 2024  Past Medical History:  Diagnosis Date   Aortic insufficiency 07/2010   moderate by echo 11-2022   Arthritis    Bruises easily    Glaucoma    Pre glaucoma Dr. Charlotte Sanes / "borderline"   Headache    Dr. Terrace Arabia   Hyperlipidemia    Melanoma [facility]) 1993   Left arm Dr Terri Piedra   MVP (mitral valve prolapse) 12/11/2015   No evidence of MVP on echo 11/2021 but there is myxomatous thickening of the mitral valve leaflets and mild MR   Osteopenia    Past Surgical History:  Procedure Laterality Date   EYE SURGERY     od cataract   HERNIA REPAIR     right inguinal/femoral   JOINT REPLACEMENT     hip   OPEN REDUCTION INTERNAL FIXATION (ORIF) DISTAL RADIAL FRACTURE Right 11/16/2013   Procedure: OPEN REDUCTION INTERNAL FIXATION (ORIF) RIGHT DISTAL RADIUS FRACTURE WITH REPAIR AS NEEDED AND ALLOGRAFT BONE GRAFT;  Surgeon: Dominica Severin, MD;  Location: [facility];  Service: Orthopedics;  Laterality: Right;   TOTAL KNEE ARTHROPLASTY Right 03/24/2015   Procedure: RIGHT TOTAL KNEE ARTHROPLASTY;  Surgeon: Ranee Gosselin, MD;  Location: [facility];  Service: Orthopedics;  Laterality: Right;     Current Meds  Medication Sig   Ascorbic Acid (VITAMIN C) 100 MG tablet Take 100 mg by mouth  daily.   bimatoprost (LUMIGAN) 0.03 % ophthalmic solution Place 1 drop into both eyes nightly.   calcium citrate (CALCITRATE - DOSED IN MG ELEMENTAL CALCIUM) 950 (200 Ca) MG tablet Take 200 mg of elemental calcium by mouth daily.   Cholecalciferol (VITAMIN D3) 250 MCG (10000 UT) capsule Take 10,000 Units by mouth once a week.   clobetasol ointment (TEMOVATE) 0.05 % Apply 1 application topically daily as needed (skin).    Omega-3 Fatty Acids (FISH OIL) 1000 MG CAPS Take by mouth.   Pyridoxine HCl (VITAMIN B6) 250 MG TABS Take 250 mg by mouth daily.   timolol (TIMOPTIC) 0.5 % ophthalmic solution Place 1 drop into both eyes 2 (two) times daily.   VYZULTA 0.024 % SOLN Place 1 drop into the right eye at bedtime.     Allergies:   Patient has no known allergies.   Social History   Tobacco Use   Smoking status: Never   Smokeless tobacco: Never  Substance Use Topics   Alcohol use: No   Drug use: No     Family Hx: The patient's family history includes Alzheimer's disease in her mother; Pancreatic cancer in her father. There is no history of Breast cancer.  ROS:   Please see the history of present illness.  All other systems reviewed and are negative.   Labs/Other Tests and Data Reviewed:    Recent Labs: No results found for requested labs within last 365 days.   Recent Lipid Panel No results found for: "CHOL", "TRIG", "HDL", "CHOLHDL", "LDLCALC", "LDLDIRECT"  Wt Readings from Last 3 Encounters:  12/21/22 99 lb 12.8 oz (45.3 kg)  11/10/21 99 lb (44.9 kg)  07/27/20 97 lb (44 kg)    EKG was performed today and showed sinus bradycardiat at 54bpm  Objective:    Vital Signs:  BP 120/82   Pulse (!) 54   Ht 5\' 3"  (1.6 m)   Wt 99 lb 12.8 oz (45.3 kg)   SpO2 90%   BMI 17.68 kg/m   GEN: Well nourished, well developed in no acute distress HEENT: Normal NECK: No JVD; No carotid bruits LYMPHATICS: No lymphadenopathy CARDIAC:RRR, no murmurs, rubs, gallops RESPIRATORY:  Clear to  auscultation without rales, wheezing or rhonchi  ABDOMEN: Soft, non-tender, non-distended MUSCULOSKELETAL:  No edema; No deformity  SKIN: Warm and dry NEUROLOGIC:  Alert and oriented x 3 PSYCHIATRIC:  Normal affect  ASSESSMENT & PLAN:    1.  Aortic insufficiency -Moderate by echo 07/2020 -2D echo 11/26/2022 showed EF 65 to 70% with moderate AI -Repeat echo in 1 year  2.  Hx of MVP -no evidence of MVP on echo 07/2018 -Trivial MR with no evidence of MVP on echo 11/2022  3.  HTN -BP is controlled on exam today -diet controlled  4.  Exertional weakness -Lexiscan Myoview showed no ischemia 07/2020 -This is resolved  Medication Adjustments/Labs and Tests Ordered: Current medicines are reviewed at length with the patient today.  Concerns regarding medicines are outlined above.  Tests Ordered: Orders Placed This Encounter  Procedures   EKG 12-Lead   Medication Changes: No orders of the defined types were placed in this encounter.   Disposition:  Follow up in 1 year(s)  Signed, Armanda Magic, MD  12/21/2022 10:10 AM    [facility]

## 2022-12-26 DIAGNOSIS — H401113 Primary open-angle glaucoma, right eye, severe stage: Secondary | ICD-10-CM | POA: Diagnosis not present

## 2023-01-19 DIAGNOSIS — R531 Weakness: Secondary | ICD-10-CM | POA: Diagnosis not present

## 2023-01-19 DIAGNOSIS — E785 Hyperlipidemia, unspecified: Secondary | ICD-10-CM | POA: Diagnosis not present

## 2023-01-19 DIAGNOSIS — I10 Essential (primary) hypertension: Secondary | ICD-10-CM | POA: Diagnosis not present

## 2023-01-19 DIAGNOSIS — M5412 Radiculopathy, cervical region: Secondary | ICD-10-CM | POA: Diagnosis not present

## 2023-01-23 DIAGNOSIS — I341 Nonrheumatic mitral (valve) prolapse: Secondary | ICD-10-CM | POA: Diagnosis not present

## 2023-01-23 DIAGNOSIS — Z0000 Encounter for general adult medical examination without abnormal findings: Secondary | ICD-10-CM | POA: Diagnosis not present

## 2023-01-23 DIAGNOSIS — E7800 Pure hypercholesterolemia, unspecified: Secondary | ICD-10-CM | POA: Diagnosis not present

## 2023-01-23 DIAGNOSIS — H409 Unspecified glaucoma: Secondary | ICD-10-CM | POA: Diagnosis not present

## 2023-01-23 DIAGNOSIS — R519 Headache, unspecified: Secondary | ICD-10-CM | POA: Diagnosis not present

## 2023-02-21 DIAGNOSIS — H401132 Primary open-angle glaucoma, bilateral, moderate stage: Secondary | ICD-10-CM | POA: Diagnosis not present

## 2023-02-21 DIAGNOSIS — H5000 Unspecified esotropia: Secondary | ICD-10-CM | POA: Diagnosis not present

## 2023-02-21 DIAGNOSIS — Z961 Presence of intraocular lens: Secondary | ICD-10-CM | POA: Diagnosis not present

## 2023-03-09 DIAGNOSIS — H442E1 Degenerative myopia with other maculopathy, right eye: Secondary | ICD-10-CM | POA: Diagnosis not present

## 2023-03-09 DIAGNOSIS — H43391 Other vitreous opacities, right eye: Secondary | ICD-10-CM | POA: Diagnosis not present

## 2023-03-09 DIAGNOSIS — H4422 Degenerative myopia, left eye: Secondary | ICD-10-CM | POA: Diagnosis not present

## 2023-03-09 DIAGNOSIS — H35371 Puckering of macula, right eye: Secondary | ICD-10-CM | POA: Diagnosis not present

## 2023-03-28 DIAGNOSIS — D485 Neoplasm of uncertain behavior of skin: Secondary | ICD-10-CM | POA: Diagnosis not present

## 2023-03-28 DIAGNOSIS — L080 Pyoderma: Secondary | ICD-10-CM | POA: Diagnosis not present

## 2023-03-28 DIAGNOSIS — L0101 Non-bullous impetigo: Secondary | ICD-10-CM | POA: Diagnosis not present

## 2023-03-28 DIAGNOSIS — C4442 Squamous cell carcinoma of skin of scalp and neck: Secondary | ICD-10-CM | POA: Diagnosis not present

## 2023-05-24 DIAGNOSIS — K080 Exfoliation of teeth due to systemic causes: Secondary | ICD-10-CM | POA: Diagnosis not present

## 2023-05-30 DIAGNOSIS — C4442 Squamous cell carcinoma of skin of scalp and neck: Secondary | ICD-10-CM | POA: Diagnosis not present

## 2023-06-23 DIAGNOSIS — C4442 Squamous cell carcinoma of skin of scalp and neck: Secondary | ICD-10-CM | POA: Diagnosis not present

## 2023-06-30 DIAGNOSIS — H401132 Primary open-angle glaucoma, bilateral, moderate stage: Secondary | ICD-10-CM | POA: Diagnosis not present

## 2023-07-04 DIAGNOSIS — H401113 Primary open-angle glaucoma, right eye, severe stage: Secondary | ICD-10-CM | POA: Diagnosis not present

## 2023-07-24 DIAGNOSIS — Z9889 Other specified postprocedural states: Secondary | ICD-10-CM | POA: Diagnosis not present

## 2023-07-24 DIAGNOSIS — C4442 Squamous cell carcinoma of skin of scalp and neck: Secondary | ICD-10-CM | POA: Diagnosis not present

## 2023-07-24 DIAGNOSIS — M952 Other acquired deformity of head: Secondary | ICD-10-CM | POA: Diagnosis not present

## 2023-07-25 DIAGNOSIS — Z483 Aftercare following surgery for neoplasm: Secondary | ICD-10-CM | POA: Diagnosis not present

## 2023-07-25 DIAGNOSIS — S0100XA Unspecified open wound of scalp, initial encounter: Secondary | ICD-10-CM | POA: Diagnosis not present

## 2023-07-25 DIAGNOSIS — Z85828 Personal history of other malignant neoplasm of skin: Secondary | ICD-10-CM | POA: Diagnosis not present

## 2023-07-25 DIAGNOSIS — C4442 Squamous cell carcinoma of skin of scalp and neck: Secondary | ICD-10-CM | POA: Diagnosis not present

## 2023-07-28 DIAGNOSIS — Z9889 Other specified postprocedural states: Secondary | ICD-10-CM | POA: Diagnosis not present

## 2023-07-28 DIAGNOSIS — C4442 Squamous cell carcinoma of skin of scalp and neck: Secondary | ICD-10-CM | POA: Diagnosis not present

## 2023-07-28 DIAGNOSIS — M952 Other acquired deformity of head: Secondary | ICD-10-CM | POA: Diagnosis not present

## 2023-08-07 DIAGNOSIS — M952 Other acquired deformity of head: Secondary | ICD-10-CM | POA: Diagnosis not present

## 2023-08-07 DIAGNOSIS — Z9889 Other specified postprocedural states: Secondary | ICD-10-CM | POA: Diagnosis not present

## 2023-08-14 DIAGNOSIS — C4442 Squamous cell carcinoma of skin of scalp and neck: Secondary | ICD-10-CM | POA: Diagnosis not present

## 2023-08-21 DIAGNOSIS — C4442 Squamous cell carcinoma of skin of scalp and neck: Secondary | ICD-10-CM | POA: Diagnosis not present

## 2023-09-04 DIAGNOSIS — C4442 Squamous cell carcinoma of skin of scalp and neck: Secondary | ICD-10-CM | POA: Diagnosis not present

## 2023-10-20 DIAGNOSIS — H401132 Primary open-angle glaucoma, bilateral, moderate stage: Secondary | ICD-10-CM | POA: Diagnosis not present

## 2023-10-24 DIAGNOSIS — H401113 Primary open-angle glaucoma, right eye, severe stage: Secondary | ICD-10-CM | POA: Diagnosis not present

## 2023-10-24 DIAGNOSIS — Z01818 Encounter for other preprocedural examination: Secondary | ICD-10-CM | POA: Diagnosis not present

## 2023-11-07 ENCOUNTER — Telehealth (HOSPITAL_COMMUNITY): Payer: Self-pay | Admitting: Cardiology

## 2023-11-07 NOTE — Telephone Encounter (Signed)
 Will send this message to Dr. Mayford Knife, to make her aware of pt refusal to get echo done.

## 2023-11-07 NOTE — Telephone Encounter (Signed)
 Patient cancelled echocardiogram for 11/08/23 per Voicemail and did not wish to reschedule. Order will be removed from the echo WQ. Thank you.

## 2023-11-08 ENCOUNTER — Ambulatory Visit (HOSPITAL_COMMUNITY): Payer: Medicare Other

## 2023-11-13 DIAGNOSIS — H401113 Primary open-angle glaucoma, right eye, severe stage: Secondary | ICD-10-CM | POA: Diagnosis not present

## 2023-11-29 DIAGNOSIS — Z0000 Encounter for general adult medical examination without abnormal findings: Secondary | ICD-10-CM | POA: Diagnosis not present

## 2023-11-29 DIAGNOSIS — Z79899 Other long term (current) drug therapy: Secondary | ICD-10-CM | POA: Diagnosis not present

## 2023-11-29 DIAGNOSIS — M810 Age-related osteoporosis without current pathological fracture: Secondary | ICD-10-CM | POA: Diagnosis not present

## 2023-12-06 DIAGNOSIS — K080 Exfoliation of teeth due to systemic causes: Secondary | ICD-10-CM | POA: Diagnosis not present

## 2024-02-12 DIAGNOSIS — Z9889 Other specified postprocedural states: Secondary | ICD-10-CM | POA: Diagnosis not present

## 2024-02-12 DIAGNOSIS — M952 Other acquired deformity of head: Secondary | ICD-10-CM | POA: Diagnosis not present

## 2024-04-09 DIAGNOSIS — H401132 Primary open-angle glaucoma, bilateral, moderate stage: Secondary | ICD-10-CM | POA: Diagnosis not present

## 2024-04-09 DIAGNOSIS — Z961 Presence of intraocular lens: Secondary | ICD-10-CM | POA: Diagnosis not present

## 2024-04-12 DIAGNOSIS — H43813 Vitreous degeneration, bilateral: Secondary | ICD-10-CM | POA: Diagnosis not present

## 2024-04-12 DIAGNOSIS — H353113 Nonexudative age-related macular degeneration, right eye, advanced atrophic without subfoveal involvement: Secondary | ICD-10-CM | POA: Diagnosis not present

## 2024-04-12 DIAGNOSIS — H4422 Degenerative myopia, left eye: Secondary | ICD-10-CM | POA: Diagnosis not present

## 2024-04-12 DIAGNOSIS — H442E1 Degenerative myopia with other maculopathy, right eye: Secondary | ICD-10-CM | POA: Diagnosis not present

## 2024-04-12 DIAGNOSIS — H43391 Other vitreous opacities, right eye: Secondary | ICD-10-CM | POA: Diagnosis not present

## 2024-06-12 DIAGNOSIS — K080 Exfoliation of teeth due to systemic causes: Secondary | ICD-10-CM | POA: Diagnosis not present
